# Patient Record
Sex: Female | Born: 1935 | Race: White | Hispanic: No | Smoking: Never smoker
Health system: Southern US, Community
[De-identification: ages and names within clinical notes are randomized; demographics above are authoritative.]

## PROBLEM LIST (undated history)

## (undated) DIAGNOSIS — I4891 Unspecified atrial fibrillation: Secondary | ICD-10-CM

## (undated) DIAGNOSIS — I350 Nonrheumatic aortic (valve) stenosis: Secondary | ICD-10-CM

## (undated) DIAGNOSIS — I10 Essential (primary) hypertension: Secondary | ICD-10-CM

## (undated) DIAGNOSIS — E876 Hypokalemia: Secondary | ICD-10-CM

## (undated) DIAGNOSIS — I739 Peripheral vascular disease, unspecified: Secondary | ICD-10-CM

## (undated) DIAGNOSIS — N189 Chronic kidney disease, unspecified: Secondary | ICD-10-CM

## (undated) DIAGNOSIS — E119 Type 2 diabetes mellitus without complications: Secondary | ICD-10-CM

## (undated) DIAGNOSIS — G4730 Sleep apnea, unspecified: Secondary | ICD-10-CM

## (undated) DIAGNOSIS — N760 Acute vaginitis: Secondary | ICD-10-CM

## (undated) DIAGNOSIS — R41841 Cognitive communication deficit: Secondary | ICD-10-CM

## (undated) DIAGNOSIS — Z950 Presence of cardiac pacemaker: Secondary | ICD-10-CM

## (undated) DIAGNOSIS — R569 Unspecified convulsions: Secondary | ICD-10-CM

## (undated) DIAGNOSIS — F0390 Unspecified dementia without behavioral disturbance: Secondary | ICD-10-CM

## (undated) DIAGNOSIS — L97509 Non-pressure chronic ulcer of other part of unspecified foot with unspecified severity: Secondary | ICD-10-CM

## (undated) DIAGNOSIS — C801 Malignant (primary) neoplasm, unspecified: Secondary | ICD-10-CM

## (undated) DIAGNOSIS — N390 Urinary tract infection, site not specified: Secondary | ICD-10-CM

## (undated) DIAGNOSIS — M4800 Spinal stenosis, site unspecified: Secondary | ICD-10-CM

---

## 2015-07-23 ENCOUNTER — Ambulatory Visit: Payer: Medicare Other

## 2015-07-31 ENCOUNTER — Ambulatory Visit: Payer: Medicare Other | Admitting: Podiatry

## 2015-12-29 ENCOUNTER — Emergency Department: Payer: Medicare Other

## 2015-12-29 ENCOUNTER — Inpatient Hospital Stay
Admission: EM | Admit: 2015-12-29 | Discharge: 2016-01-08 | DRG: 291 | Disposition: A | Discharge status: Skilled Nursing Facility | Payer: Medicare Other | Attending: Internal Medicine | Admitting: Internal Medicine

## 2015-12-29 ENCOUNTER — Encounter: Payer: Self-pay | Admitting: Emergency Medicine

## 2015-12-29 DIAGNOSIS — E1122 Type 2 diabetes mellitus with diabetic chronic kidney disease: Secondary | ICD-10-CM | POA: Diagnosis present

## 2015-12-29 DIAGNOSIS — J9621 Acute and chronic respiratory failure with hypoxia: Secondary | ICD-10-CM | POA: Diagnosis present

## 2015-12-29 DIAGNOSIS — L97519 Non-pressure chronic ulcer of other part of right foot with unspecified severity: Secondary | ICD-10-CM | POA: Diagnosis present

## 2015-12-29 DIAGNOSIS — L97529 Non-pressure chronic ulcer of other part of left foot with unspecified severity: Secondary | ICD-10-CM | POA: Diagnosis present

## 2015-12-29 DIAGNOSIS — Y95 Nosocomial condition: Secondary | ICD-10-CM | POA: Diagnosis present

## 2015-12-29 DIAGNOSIS — Y92230 Patient room in hospital as the place of occurrence of the external cause: Secondary | ICD-10-CM | POA: Diagnosis present

## 2015-12-29 DIAGNOSIS — R34 Anuria and oliguria: Secondary | ICD-10-CM | POA: Diagnosis not present

## 2015-12-29 DIAGNOSIS — G309 Alzheimer's disease, unspecified: Secondary | ICD-10-CM | POA: Diagnosis present

## 2015-12-29 DIAGNOSIS — J9610 Chronic respiratory failure, unspecified whether with hypoxia or hypercapnia: Secondary | ICD-10-CM | POA: Diagnosis not present

## 2015-12-29 DIAGNOSIS — N183 Chronic kidney disease, stage 3 (moderate): Secondary | ICD-10-CM | POA: Diagnosis present

## 2015-12-29 DIAGNOSIS — Z825 Family history of asthma and other chronic lower respiratory diseases: Secondary | ICD-10-CM | POA: Diagnosis not present

## 2015-12-29 DIAGNOSIS — E871 Hypo-osmolality and hyponatremia: Secondary | ICD-10-CM | POA: Diagnosis present

## 2015-12-29 DIAGNOSIS — I2510 Atherosclerotic heart disease of native coronary artery without angina pectoris: Secondary | ICD-10-CM | POA: Diagnosis present

## 2015-12-29 DIAGNOSIS — E039 Hypothyroidism, unspecified: Secondary | ICD-10-CM | POA: Diagnosis present

## 2015-12-29 DIAGNOSIS — I509 Heart failure, unspecified: Secondary | ICD-10-CM | POA: Diagnosis not present

## 2015-12-29 DIAGNOSIS — Z8249 Family history of ischemic heart disease and other diseases of the circulatory system: Secondary | ICD-10-CM

## 2015-12-29 DIAGNOSIS — Z950 Presence of cardiac pacemaker: Secondary | ICD-10-CM

## 2015-12-29 DIAGNOSIS — Z794 Long term (current) use of insulin: Secondary | ICD-10-CM | POA: Diagnosis not present

## 2015-12-29 DIAGNOSIS — Z91040 Latex allergy status: Secondary | ICD-10-CM | POA: Diagnosis not present

## 2015-12-29 DIAGNOSIS — Z888 Allergy status to other drugs, medicaments and biological substances status: Secondary | ICD-10-CM

## 2015-12-29 DIAGNOSIS — R739 Hyperglycemia, unspecified: Secondary | ICD-10-CM | POA: Diagnosis not present

## 2015-12-29 DIAGNOSIS — Z6831 Body mass index (BMI) 31.0-31.9, adult: Secondary | ICD-10-CM

## 2015-12-29 DIAGNOSIS — E1151 Type 2 diabetes mellitus with diabetic peripheral angiopathy without gangrene: Secondary | ICD-10-CM | POA: Diagnosis present

## 2015-12-29 DIAGNOSIS — J9601 Acute respiratory failure with hypoxia: Secondary | ICD-10-CM

## 2015-12-29 DIAGNOSIS — J440 Chronic obstructive pulmonary disease with acute lower respiratory infection: Secondary | ICD-10-CM | POA: Diagnosis present

## 2015-12-29 DIAGNOSIS — N179 Acute kidney failure, unspecified: Secondary | ICD-10-CM

## 2015-12-29 DIAGNOSIS — G4733 Obstructive sleep apnea (adult) (pediatric): Secondary | ICD-10-CM | POA: Diagnosis present

## 2015-12-29 DIAGNOSIS — I959 Hypotension, unspecified: Secondary | ICD-10-CM | POA: Diagnosis present

## 2015-12-29 DIAGNOSIS — E669 Obesity, unspecified: Secondary | ICD-10-CM | POA: Diagnosis present

## 2015-12-29 DIAGNOSIS — I739 Peripheral vascular disease, unspecified: Secondary | ICD-10-CM | POA: Diagnosis present

## 2015-12-29 DIAGNOSIS — J189 Pneumonia, unspecified organism: Secondary | ICD-10-CM | POA: Diagnosis not present

## 2015-12-29 DIAGNOSIS — I252 Old myocardial infarction: Secondary | ICD-10-CM | POA: Diagnosis not present

## 2015-12-29 DIAGNOSIS — R339 Retention of urine, unspecified: Secondary | ICD-10-CM | POA: Diagnosis not present

## 2015-12-29 DIAGNOSIS — Z833 Family history of diabetes mellitus: Secondary | ICD-10-CM

## 2015-12-29 DIAGNOSIS — T380X5A Adverse effect of glucocorticoids and synthetic analogues, initial encounter: Secondary | ICD-10-CM | POA: Diagnosis not present

## 2015-12-29 DIAGNOSIS — T501X5A Adverse effect of loop [high-ceiling] diuretics, initial encounter: Secondary | ICD-10-CM | POA: Diagnosis present

## 2015-12-29 DIAGNOSIS — J159 Unspecified bacterial pneumonia: Secondary | ICD-10-CM | POA: Diagnosis present

## 2015-12-29 DIAGNOSIS — I130 Hypertensive heart and chronic kidney disease with heart failure and stage 1 through stage 4 chronic kidney disease, or unspecified chronic kidney disease: Principal | ICD-10-CM | POA: Diagnosis present

## 2015-12-29 DIAGNOSIS — Z89422 Acquired absence of other left toe(s): Secondary | ICD-10-CM

## 2015-12-29 DIAGNOSIS — Z79899 Other long term (current) drug therapy: Secondary | ICD-10-CM

## 2015-12-29 DIAGNOSIS — Z8673 Personal history of transient ischemic attack (TIA), and cerebral infarction without residual deficits: Secondary | ICD-10-CM

## 2015-12-29 DIAGNOSIS — I5023 Acute on chronic systolic (congestive) heart failure: Secondary | ICD-10-CM | POA: Diagnosis present

## 2015-12-29 DIAGNOSIS — J9600 Acute respiratory failure, unspecified whether with hypoxia or hypercapnia: Secondary | ICD-10-CM | POA: Diagnosis not present

## 2015-12-29 DIAGNOSIS — E1165 Type 2 diabetes mellitus with hyperglycemia: Secondary | ICD-10-CM | POA: Diagnosis present

## 2015-12-29 DIAGNOSIS — I248 Other forms of acute ischemic heart disease: Secondary | ICD-10-CM | POA: Diagnosis present

## 2015-12-29 DIAGNOSIS — F0280 Dementia in other diseases classified elsewhere without behavioral disturbance: Secondary | ICD-10-CM | POA: Diagnosis present

## 2015-12-29 DIAGNOSIS — J810 Acute pulmonary edema: Secondary | ICD-10-CM | POA: Diagnosis not present

## 2015-12-29 DIAGNOSIS — E11621 Type 2 diabetes mellitus with foot ulcer: Secondary | ICD-10-CM | POA: Diagnosis present

## 2015-12-29 DIAGNOSIS — E875 Hyperkalemia: Secondary | ICD-10-CM | POA: Diagnosis present

## 2015-12-29 DIAGNOSIS — I5020 Unspecified systolic (congestive) heart failure: Secondary | ICD-10-CM | POA: Diagnosis not present

## 2015-12-29 HISTORY — DX: Hypokalemia: E87.6

## 2015-12-29 HISTORY — DX: Essential (primary) hypertension: I10

## 2015-12-29 HISTORY — DX: Non-pressure chronic ulcer of other part of unspecified foot with unspecified severity: L97.509

## 2015-12-29 HISTORY — DX: Unspecified dementia, unspecified severity, without behavioral disturbance, psychotic disturbance, mood disturbance, and anxiety: F03.90

## 2015-12-29 HISTORY — DX: Spinal stenosis, site unspecified: M48.00

## 2015-12-29 HISTORY — DX: Presence of cardiac pacemaker: Z95.0

## 2015-12-29 HISTORY — DX: Type 2 diabetes mellitus without complications: E11.9

## 2015-12-29 HISTORY — DX: Acute vaginitis: N76.0

## 2015-12-29 HISTORY — DX: Unspecified convulsions: R56.9

## 2015-12-29 HISTORY — DX: Peripheral vascular disease, unspecified: I73.9

## 2015-12-29 HISTORY — DX: Chronic kidney disease, unspecified: N18.9

## 2015-12-29 HISTORY — DX: Malignant (primary) neoplasm, unspecified: C80.1

## 2015-12-29 HISTORY — DX: Cognitive communication deficit: R41.841

## 2015-12-29 HISTORY — DX: Urinary tract infection, site not specified: N39.0

## 2015-12-29 HISTORY — DX: Nonrheumatic aortic (valve) stenosis: I35.0

## 2015-12-29 HISTORY — DX: Unspecified atrial fibrillation: I48.91

## 2015-12-29 HISTORY — DX: Sleep apnea, unspecified: G47.30

## 2015-12-29 LAB — BLOOD GAS, ARTERIAL
ACID-BASE EXCESS: 8.8 mmol/L — AB (ref 0.0–3.0)
BICARBONATE: 36.7 meq/L — AB (ref 21.0–28.0)
Delivery systems: POSITIVE
EXPIRATORY PAP: 8
FIO2: 0.4
Inspiratory PAP: 12
MECHANICAL RATE: 10
O2 SAT: 96.4 %
PATIENT TEMPERATURE: 37
PH ART: 7.36 (ref 7.350–7.450)
PO2 ART: 88 mmHg (ref 83.0–108.0)
pCO2 arterial: 65 mmHg — ABNORMAL HIGH (ref 32.0–48.0)

## 2015-12-29 LAB — BASIC METABOLIC PANEL
ANION GAP: 9 (ref 5–15)
BUN: 64 mg/dL — ABNORMAL HIGH (ref 6–20)
CHLORIDE: 99 mmol/L — AB (ref 101–111)
CO2: 32 mmol/L (ref 22–32)
Calcium: 8.9 mg/dL (ref 8.9–10.3)
Creatinine, Ser: 1.75 mg/dL — ABNORMAL HIGH (ref 0.44–1.00)
GFR, EST AFRICAN AMERICAN: 31 mL/min — AB (ref >60)
GFR, EST NON AFRICAN AMERICAN: 27 mL/min — AB (ref >60)
Glucose, Bld: 392 mg/dL — ABNORMAL HIGH (ref 65–99)
POTASSIUM: 4.4 mmol/L (ref 3.5–5.1)
SODIUM: 140 mmol/L (ref 135–145)

## 2015-12-29 LAB — CBC WITH DIFFERENTIAL/PLATELET
BASOS ABS: 0 10*3/uL (ref 0–0.1)
BASOS PCT: 0 %
Eosinophils Absolute: 0.1 10*3/uL (ref 0–0.7)
Eosinophils Relative: 0 %
HEMATOCRIT: 33.4 % — AB (ref 35.0–47.0)
Hemoglobin: 10.8 g/dL — ABNORMAL LOW (ref 12.0–16.0)
LYMPHS PCT: 4 %
Lymphs Abs: 0.8 10*3/uL — ABNORMAL LOW (ref 1.0–3.6)
MCH: 29.2 pg (ref 26.0–34.0)
MCHC: 32.3 g/dL (ref 32.0–36.0)
MCV: 90.5 fL (ref 80.0–100.0)
MONO ABS: 0.9 10*3/uL (ref 0.2–0.9)
Monocytes Relative: 5 %
NEUTROS ABS: 18 10*3/uL — AB (ref 1.4–6.5)
Neutrophils Relative %: 91 %
PLATELETS: 232 10*3/uL (ref 150–440)
RBC: 3.69 MIL/uL — AB (ref 3.80–5.20)
RDW: 18.7 % — AB (ref 11.5–14.5)
WBC: 19.8 10*3/uL — AB (ref 3.6–11.0)

## 2015-12-29 LAB — TROPONIN I: TROPONIN I: 0.06 ng/mL — AB (ref <0.031)

## 2015-12-29 LAB — LACTIC ACID, PLASMA: Lactic Acid, Venous: 1.5 mmol/L (ref 0.5–2.0)

## 2015-12-29 MED ORDER — SODIUM CHLORIDE 0.9 % IJ SOLN
3.0000 mL | Freq: Two times a day (BID) | INTRAMUSCULAR | Status: DC
  Administered 2015-12-30 – 2016-01-08 (×19): 3 mL via INTRAVENOUS

## 2015-12-29 MED ORDER — VANCOMYCIN HCL IN DEXTROSE 1-5 GM/200ML-% IV SOLN
1000.0000 mg | Freq: Once | INTRAVENOUS | Status: AC
  Administered 2015-12-30: 1000 mg via INTRAVENOUS
  Filled 2015-12-29 (×3): qty 200

## 2015-12-29 MED ORDER — SODIUM CHLORIDE 0.9 % IJ SOLN
3.0000 mL | Freq: Two times a day (BID) | INTRAMUSCULAR | Status: DC
  Administered 2015-12-30 – 2016-01-05 (×13): 3 mL via INTRAVENOUS

## 2015-12-29 MED ORDER — ACETAMINOPHEN 325 MG PO TABS
650.0000 mg | ORAL_TABLET | Freq: Four times a day (QID) | ORAL | Status: DC | PRN
Start: 2015-12-29 — End: 2016-01-08

## 2015-12-29 MED ORDER — FUROSEMIDE 10 MG/ML IJ SOLN
80.0000 mg | Freq: Once | INTRAMUSCULAR | Status: AC
  Administered 2015-12-30: 80 mg via INTRAVENOUS
  Filled 2015-12-29: qty 8

## 2015-12-29 MED ORDER — PIPERACILLIN-TAZOBACTAM 3.375 G IVPB
3.3750 g | Freq: Once | INTRAVENOUS | Status: AC
  Administered 2015-12-30: 3.375 g via INTRAVENOUS
  Filled 2015-12-29: qty 50

## 2015-12-29 MED ORDER — ASPIRIN EC 81 MG PO TBEC
81.0000 mg | DELAYED_RELEASE_TABLET | Freq: Every day | ORAL | Status: DC
  Administered 2015-12-30 – 2016-01-08 (×10): 81 mg via ORAL
  Filled 2015-12-29 (×10): qty 1

## 2015-12-29 MED ORDER — LEVOFLOXACIN IN D5W 750 MG/150ML IV SOLN
750.0000 mg | Freq: Once | INTRAVENOUS | Status: AC
  Administered 2015-12-30: 750 mg via INTRAVENOUS
  Filled 2015-12-29: qty 150

## 2015-12-29 MED ORDER — NITROGLYCERIN 2 % TD OINT
TOPICAL_OINTMENT | TRANSDERMAL | Status: AC
  Administered 2015-12-29: 1 [in_us] via TOPICAL
  Filled 2015-12-29: qty 1

## 2015-12-29 MED ORDER — ONDANSETRON HCL 4 MG/2ML IJ SOLN
4.0000 mg | Freq: Four times a day (QID) | INTRAMUSCULAR | Status: DC | PRN
Start: 2015-12-29 — End: 2016-01-08
  Administered 2015-12-30: 4 mg via INTRAVENOUS
  Filled 2015-12-29: qty 2

## 2015-12-29 MED ORDER — SODIUM CHLORIDE 0.9 % IV SOLN: 250.0000 mL | INTRAVENOUS | Status: DC | PRN

## 2015-12-29 MED ORDER — ACETAMINOPHEN 650 MG RE SUPP: 650.0000 mg | Freq: Four times a day (QID) | RECTAL | Status: DC | PRN

## 2015-12-29 MED ORDER — ALBUTEROL SULFATE (2.5 MG/3ML) 0.083% IN NEBU
5.0000 mg | INHALATION_SOLUTION | Freq: Once | RESPIRATORY_TRACT | Status: AC
  Administered 2015-12-29: 5 mg via RESPIRATORY_TRACT
  Filled 2015-12-29: qty 6

## 2015-12-29 MED ORDER — AZITHROMYCIN 250 MG PO TABS: 500.0000 mg | ORAL_TABLET | Freq: Every day | ORAL | Status: DC

## 2015-12-29 MED ORDER — METHYLPREDNISOLONE SODIUM SUCC 125 MG IJ SOLR
60.0000 mg | Freq: Two times a day (BID) | INTRAMUSCULAR | Status: DC
  Administered 2015-12-30 – 2016-01-01 (×5): 60 mg via INTRAVENOUS
  Filled 2015-12-29 (×5): qty 2

## 2015-12-29 MED ORDER — SODIUM CHLORIDE 0.9 % IJ SOLN
3.0000 mL | INTRAMUSCULAR | Status: DC | PRN
  Administered 2015-12-30: 3 mL via INTRAVENOUS
  Filled 2015-12-29: qty 10

## 2015-12-29 MED ORDER — INSULIN ASPART 100 UNIT/ML ~~LOC~~ SOLN
0.0000 [IU] | Freq: Every day | SUBCUTANEOUS | Status: DC
  Administered 2015-12-30: 4 [IU] via SUBCUTANEOUS
  Administered 2015-12-31: 5 [IU] via SUBCUTANEOUS
  Administered 2016-01-04 – 2016-01-07 (×2): 2 [IU] via SUBCUTANEOUS
  Filled 2015-12-29: qty 4
  Filled 2015-12-29: qty 11
  Filled 2015-12-29: qty 5
  Filled 2015-12-29 (×2): qty 2

## 2015-12-29 MED ORDER — ENOXAPARIN SODIUM 30 MG/0.3ML ~~LOC~~ SOLN
30.0000 mg | SUBCUTANEOUS | Status: DC
  Administered 2015-12-30 – 2015-12-31 (×2): 30 mg via SUBCUTANEOUS
  Filled 2015-12-29 (×2): qty 0.3

## 2015-12-29 MED ORDER — METHYLPREDNISOLONE SODIUM SUCC 125 MG IJ SOLR
125.0000 mg | Freq: Once | INTRAMUSCULAR | Status: AC
  Administered 2015-12-29: 125 mg via INTRAVENOUS
  Filled 2015-12-29: qty 2

## 2015-12-29 MED ORDER — NITROGLYCERIN 2 % TD OINT
1.0000 [in_us] | TOPICAL_OINTMENT | TRANSDERMAL | Status: AC
  Administered 2015-12-29: 1 [in_us] via TOPICAL
  Filled 2015-12-29: qty 1

## 2015-12-29 MED ORDER — BISACODYL 10 MG RE SUPP: 10.0000 mg | Freq: Every day | RECTAL | Status: DC | PRN

## 2015-12-29 MED ORDER — POLYETHYLENE GLYCOL 3350 17 G PO PACK
17.0000 g | PACK | Freq: Every day | ORAL | Status: DC | PRN
  Administered 2016-01-06: 17 g via ORAL
  Filled 2015-12-29: qty 1

## 2015-12-29 MED ORDER — IPRATROPIUM-ALBUTEROL 0.5-2.5 (3) MG/3ML IN SOLN
3.0000 mL | RESPIRATORY_TRACT | Status: DC
  Administered 2015-12-30 – 2016-01-01 (×14): 3 mL via RESPIRATORY_TRACT
  Filled 2015-12-29 (×14): qty 3

## 2015-12-29 MED ORDER — INSULIN ASPART 100 UNIT/ML ~~LOC~~ SOLN
0.0000 [IU] | Freq: Three times a day (TID) | SUBCUTANEOUS | Status: DC
  Administered 2015-12-30: 15 [IU] via SUBCUTANEOUS
  Administered 2015-12-30: 11 [IU] via SUBCUTANEOUS
  Administered 2015-12-31: 15 [IU] via SUBCUTANEOUS
  Administered 2015-12-31: 3 [IU] via SUBCUTANEOUS
  Administered 2015-12-31 – 2016-01-01 (×2): 11 [IU] via SUBCUTANEOUS
  Administered 2016-01-01: 15 [IU] via SUBCUTANEOUS
  Administered 2016-01-01: 11 [IU] via SUBCUTANEOUS
  Administered 2016-01-02: 4 [IU] via SUBCUTANEOUS
  Administered 2016-01-03: 11 [IU] via SUBCUTANEOUS
  Administered 2016-01-04 (×2): 4 [IU] via SUBCUTANEOUS
  Administered 2016-01-04: 3 [IU] via SUBCUTANEOUS
  Administered 2016-01-05 (×3): 4 [IU] via SUBCUTANEOUS
  Administered 2016-01-06: 7 [IU] via SUBCUTANEOUS
  Administered 2016-01-06: 4 [IU] via SUBCUTANEOUS
  Administered 2016-01-07: 11 [IU] via SUBCUTANEOUS
  Administered 2016-01-07: 4 [IU] via SUBCUTANEOUS
  Administered 2016-01-07: 7 [IU] via SUBCUTANEOUS
  Administered 2016-01-08 (×2): 3 [IU] via SUBCUTANEOUS
  Filled 2015-12-29 (×2): qty 4
  Filled 2015-12-29: qty 11
  Filled 2015-12-29: qty 4
  Filled 2015-12-29: qty 11
  Filled 2015-12-29 (×2): qty 4
  Filled 2015-12-29: qty 7
  Filled 2015-12-29: qty 15
  Filled 2015-12-29: qty 11
  Filled 2015-12-29: qty 4
  Filled 2015-12-29: qty 3
  Filled 2015-12-29: qty 4
  Filled 2015-12-29: qty 15
  Filled 2015-12-29: qty 7
  Filled 2015-12-29: qty 11
  Filled 2015-12-29: qty 3
  Filled 2015-12-29: qty 4
  Filled 2015-12-29: qty 3
  Filled 2015-12-29: qty 11
  Filled 2015-12-29: qty 3
  Filled 2015-12-29: qty 15

## 2015-12-29 MED ORDER — ONDANSETRON HCL 4 MG PO TABS: 4.0000 mg | ORAL_TABLET | Freq: Four times a day (QID) | ORAL | Status: DC | PRN

## 2015-12-29 MED ORDER — FUROSEMIDE 10 MG/ML IJ SOLN
40.0000 mg | Freq: Two times a day (BID) | INTRAMUSCULAR | Status: DC
  Administered 2015-12-30 – 2015-12-31 (×3): 40 mg via INTRAVENOUS
  Filled 2015-12-29 (×3): qty 4

## 2015-12-29 MED ORDER — DOCUSATE SODIUM 100 MG PO CAPS
100.0000 mg | ORAL_CAPSULE | Freq: Two times a day (BID) | ORAL | Status: DC
  Administered 2015-12-30 – 2016-01-08 (×19): 100 mg via ORAL
  Filled 2015-12-29 (×20): qty 1

## 2015-12-29 NOTE — ED Notes (Signed)
Patient brought in by ems from [facility]. Patient with respiratory distress. Patient oxygen saturations were in the 70's by ems. Patient was given 2 duonebs by ems.

## 2015-12-29 NOTE — ED Provider Notes (Signed)
Patient resting comfortably. Alerts well, amicable and in no distress on BiPAP. No longer hypoxic, in no distress. Updated family and discussed case and care. After discussion with the family, she does follow [facility] however she is doing well here and they request admission to our hospital after further discussion. I originally wanted to [facility], but given that she is on BiPAP she require critical care transport, and family does not wish to wait for a long transfer process. Admitted.   Delman Kitten, MD 12/29/15 [phone]

## 2015-12-29 NOTE — ED Provider Notes (Signed)
[facility] Emergency Department Provider Note  ____________________________________________  Time seen: 10:00 PM on arrival by EMS  I have reviewed the triage vital signs and the nursing notes.   HISTORY  Chief Complaint Respiratory Distress  History Limited by respirator distress.  HPI Kylie Martinez is a 79 y.o. female sent to the ED [facility] for respiratory distress.This happened suddenly approximately 30 or 45 minutes ago. Staff at this facility report that the patient has a history of COPD and hypercapnia. Patient is unable to provide history at this time due to her respiratory distress.  [employer] medical record reveals history of hypertension diabetes CAD intracranial hemorrhage 2 years ago CK D pacemaker and CHF. No mention of COPD on her problem list from the facility, there are no listing for any bronchodilators. She is on BiPAP at all times per her medication reconciliation.  Room air saturation at the facility was about 75%. On 6 L nasal cannula she is about 85%. On nonrebreather she is 88% On arrival.    Active Allergies Allergen Noted Date Severity Reactions Comments  Ace Inhibitors 03/22/2014  Other (See Comments) Hyperkalemia   Arb-Angiotensin Receptor Antagonist 03/22/2014  Other (See Comments) Hyperkalemia   Dristan Allergy 10/03/2013 Medium Itching   Latex 09/12/2013 Low Rash Other reaction(s): Rash   Levetiracetam 05/11/2014 Low Itching   Other 09/12/2013   Other reaction(s): Nausea and Vomiting  Drestane   Silvadene 07/19/2015  Other (See Comments) Silver alganate tape and cream   Silver 08/01/2015 Medium Rash Patient's daughter stated her foot blew up in a red rash   Current Medications Prescription Sig. Disp. Refills Start Date End Date Status  aspirin 81 MG chewable tablet Chew 1 tablet (81 mg total) daily. 30 tablet  11 09/10/2014 09/10/2016 Active  polyethylene glycol (MIRALAX) 17 gram packet Take  17 g by mouth two (2) times a day as needed.      Active  docusate sodium (COLACE) 100 MG capsule Take 1 capsule (100 mg total) by mouth Two (2) times a day. 60 capsule  1 01/02/2015 01/02/2016 Active  ascorbic acid (VITAMIN C) 500 MG tablet Take 500 mg by mouth daily.      Active  Lactobacillus acidoph-L.bulgar (LACTINEX) 100 million cell tablet Take 1 packet by mouth Two (2) times a day.     Active  multivitamin (THERAGRAN) per tablet Take 1 tablet by mouth daily. 30 tablet  11 06/08/2015 06/07/2016 Active  acetaminophen (TYLENOL) 325 MG tablet Take 2 tablets (650 mg total) by mouth every six (6) hours as needed. 30 tablet  0 08/03/2015  Active  amLODIPine (NORVASC) 10 MG tablet Take 1 tablet (10 mg total) by mouth daily. 30 tablet  11 08/03/2015 08/02/2016 Active  gabapentin (NEURONTIN) 100 MG capsule Take 1 capsule (100 mg total) by mouth Two (2) times a day. 270 capsule  3 08/03/2015 08/02/2016 Active  potassium chloride SA (K-DUR,KLOR-CON) 20 MEQ tablet Take 1 tablet (20 mEq total) by mouth daily. 30 tablet  11 08/03/2015 08/02/2016 Active  omega-3 fatty acids-vitamin E (FISH OIL) 1,000 mg cap Take 1,000 mg by mouth daily at 0600. 90 each  3 08/08/2015  Active  nitroglycerin (NITROSTAT) 0.4 MG SL tablet Place 0.4 mg under the tongue every five (5) minutes as needed for chest pain.     Active  insulin NPH (HUMULIN,NOVOLIN) 100 unit/mL injection Inject 20 Units under the skin daily. 26units qAM 21units qPM 3 mL  11 10/10/2015 10/09/2016 Active  furosemide (LASIX) 40  MG tablet Take 3 tablets (120 mg total) by mouth daily.  0 10/10/2015  Active  ferrous sulfate 325 (65 FE) MG tablet Take 1 tablet (325 mg total) by mouth daily. 30 tablet  3 10/10/2015 10/09/2016 Active  clindamycin (CLEOCIN) 300 MG capsule Take 300 mg by mouth three (3) times a day (at 6am, noon and 6pm). Take for 7 days   11/02/2015  Active  carvedilol (COREG) 3.125 MG tablet Take 1 tablet (3.125 mg  total) by mouth Two (2) times a day. 60 tablet  11 11/09/2015 11/08/2016 Active  Active Problems Problem Noted Date  Non-pressure chronic ulcer of other part of left foot with necrosis of bone (RAF-HCC) 10/01/2015  Presence of cardiac pacemaker 05/19/2015  Critical lower limb ischemia 05/09/2015  Diabetic ulcer of left foot associated with type 2 diabetes mellitus (RAF-HCC) 05/01/2015  Ischemic ulcer of toe (RAF-HCC) 03/19/2015  PAD (peripheral artery disease) (RAF-HCC) 03/19/2015  Dry gangrene (RAF-HCC) 03/19/2015  Foot ulcer (RAF-HCC) 03/16/2015  Itching 03/08/2015  Periodic limb movement 03/08/2015  Aortic stenosis 03/08/2015  Overview:   Moderate, echo 5/16. Pt and family not interested in surgery given risks   Hypothyroidism 03/08/2015  Sleep apnea 08/01/2014  Overview:   Home NIPPV settings 20/17 Baseline pCO2 50s   Vascular dementia 05/15/2014  Hypercarbia 05/15/2014  Stroke (RAF-HCC) [id number]  Combined systolic and diastolic heart failure (RAF-HCC) 04/20/2014  Overview:   05/01/15 Echo: LVEF 45-50%    Obesity (BMI 30-39.9) 01/13/2014  Intracranial hemorrhage (RAF-HCC) 07/14/2013  Overview:   Last Assessment & Plan:  Off Xarelto and Aspirin. Continue to monitor.   Chronic kidney disease 07/13/2013  Atrial fibrillation (RAF-HCC) 07/13/2013  Overview:   On asa    Essential hypertension 07/13/2013  Convulsions (RAF-HCC) 07/13/2013  Seizure (RAF-HCC) 07/13/2013  Overview:   Last Assessment & Plan:  No seizure activity on EEG. Keppra IV given inconsistence PO intake.   Acute non-ST-elevation myocardial infarction (RAF-HCC) 07/13/2013  Overview:   Last Assessment & Plan:  Hold aspirin and xarelto for now. Continue statin Patient's family worried about wide complexes on telemetry Patient in Afib with paced rhythm with no changes compared to The previous EKGs   Type II diabetes mellitus, uncontrolled (RAF-HCC) 07/09/2013  Overview:   06/2015 a1c 7.2    Spinal stenosis of lumbar region with neurogenic claudication 05/30/2011  Resolved Problems Problem Noted Date Resolved Date  Acute renal failure (ARF) (RAF-HCC) 07/29/2015 09/06/2015  Hyponatremia 07/29/2015 10/09/2015  Hypokalemia 07/29/2015 10/09/2015  Cellulitis of foot, left 05/19/2015 09/06/2015  Cellulitis 03/16/2015 09/06/2015  Memory change 03/08/2015 10/09/2015  Moderate obstructive sleep apnea 03/08/2015 10/09/2015  Chest pain 09/10/2014 09/10/2014  Memory deficit 10/19/2013 05/15/2014  Intracranial hemorrhage (RAF-HCC) 07/14/2013 05/15/2014  Acute renal failure (RAF-HCC) 07/13/2013 01/02/2015  Bacteremia 07/13/2013 01/02/2015  Acute myocardial infarction, subendocardial infarction (RAF-HCC) 07/13/2013 05/02/2015  Nonspecific elevation of level of transaminase or lactic acid dehydrogenase (LDH) 07/13/2013 10/09/2015  Gram-positive bacteremia 07/13/2013 09/06/2015  Overview:   Last Assessment & Plan:  Off antibiotics per ID. Will repeat blood cultures in 48 hours.   Elevation of level of transaminase or lactic acid dehydrogenase (LDH) 07/13/2013 10/09/2015  Overview:   Last Assessment & Plan:  improving   Hypertension 07/13/2013 10/09/2015  Overview:   Last Assessment & Plan:  Hydralazine prn metoprolol   Encephalopathy 07/09/2013 09/06/2015  Delirium 07/09/2013 09/06/2015  Overview:   Last Assessment & Plan:  EEG shows diffuse slowing the brain activity consistent with encephalopathy  No seizures noted. ?due to intracranial hemorrhage  Type 2 diabetes mellitus with hyperglycemia (RAF-HCC) 07/09/2013 10/09/2015  Overview:   Last Assessment & Plan:   No further hypoglycemic eppisodes Continue lispro correctional scale   Spinal stenosis of lumbar region 05/30/2011 10/09/2015  Most Recent Encounters Date Type Specialty Providers Description  11/09/2015 Orders Only Geriatric Medicine Maryan Rued, MD  Chronic combined systolic and diastolic  congestive heart failure (RAF-HCC) (Primary Dx)  11/08/2015 Documentation Geriatric Medicine Maryan Rued, MD    11/08/2015 MyChart Message Encounter Geriatric Medicine Maryan Rued, MD  [id number] Question  Immunizations Name Dates Previously Given Next Due  Influenza Virus Vaccine, unspecified formulation 09/14/2012, 12/09/2010, 10/22/2007   Influenza(Tri IIV3)6-35 MO PF(IM)Historical 10/10/2015   Pneumococcal Conjugate 13-Valent 08/08/2014   Pneumococcal Polysaccharide 23 09/26/2013   Td (adult) unspecified formulation 07/15/2006   Surgical History Surgery Date Comments  Mastectomy    Carpal Tunnel Release    Cholecystectomy    Thyroidectomy, Partial 12/29/1974   Cataract Extraction    Minimally Invasive Microdiscectomy Lumbar Spine    Pr Debridement, Skin, Sub-Q Tissue,Muscle,Bone,=<20 Sq Cm 06/05/2015 Procedure: DEBRIDEMENT; SKIN, SUBCUTANEOUS TISSUE, MUSCLE, & BONE FOOT; Surgeon: Christean Grief, MD; Location: MAIN [facility]; Service: Vascular  Medical History Medical History Date Comments  Memory deficit 10/19/2013   Type II or unspecified type diabetes mellitus without mention of complication, not stated as uncontrolled    Essential hypertension    Atrial fibrillation (RAF-HCC)    CKD (chronic kidney disease), stage IV (RAF-HCC)    Hemorrhage, intracranial (RAF-HCC) 06/2013 Probable hemorrhagic conversion of ischemic CVA while on Xarelto  Stroke (RAF-HCC) 06/2013   Sleep apnea    Cancer (RAF-HCC) 12/29/2005 R breast. No chemo or XRT needed.  Combined systolic and diastolic heart failure (RAF-HCC) 04/20/2014   Pacemaker 2001 For complete heart block. [employer]  Dry gangrene (RAF-HCC) 03/19/2015   Family History Medical History Relation Name Comments  Cancer Mother  lung  Diabetes Mother    Neuropathy Mother    Emphysema Mother    Cancer Sister ##Sister1 lung  Diabetes Sister ##Sister1   Heart disease  Sister ##Sister1    Relation Name Status Comments  Mother  Deceased Age:23's   Father  Deceased Age:45's   Social History Tobacco Use Types Packs/Day Years Used Date  Never Smoker       Smokeless Tobacco: Never Used          No past medical history on file. As above  There are no active problems to display for this patient.    No past surgical history on file.   No current outpatient prescriptions on file.   Allergies Review of patient's allergies indicates not on file.   No family history on file.  Social History Social History  Substance Use Topics  . Smoking status: Not on file  . Smokeless tobacco: Not on file  . Alcohol Use: Not on file    Review of Systems  Unable to obtain due to severe respiratory distress. Unable to speak.  ____________________________________________   PHYSICAL EXAM:  VITAL SIGNS: ED Triage Vitals  Enc Vitals Group     BP --      Pulse --      Resp --      Temp --      Temp src --      SpO2 --      Weight --      Height --      Head Cir --      Peak Flow --  Pain Score --      Pain Loc --      Pain Edu? --      Excl. in [place]? --     Vital signs reviewed, nursing assessments reviewed.   Constitutional:   Alert and oriented. Severe respiratory distress Eyes:   No scleral icterus. No conjunctival pallor. PERRL. EOMI ENT   Head:   Normocephalic and atraumatic.   Nose:   No congestion/rhinnorhea. No septal hematoma   Mouth/Throat:   MMM, no pharyngeal erythema. No peritonsillar mass. No uvula shift.   Neck:   No stridor. No SubQ emphysema. No meningismus. Hematological/Lymphatic/Immunilogical:   No cervical lymphadenopathy. Cardiovascular:   RRR. Normal and symmetric distal pulses are present in all extremities. No murmurs, rubs, or gallops. Respiratory:   Severely diminished breath sounds in all fields, but there is audible air entry. Crackles in the bilateral lower and midlung fields. Normal  expiratory phase, no audible wheezing.. Gastrointestinal:   Soft and nontender. No distention. There is no CVA tenderness.  No rebound, rigidity, or guarding. Genitourinary:   deferred Musculoskeletal:   Nontender with normal range of motion in all extremities. No joint effusions.  No lower extremity tenderness.  No edema. Neurologic:   Normal speech and language.  CN 2-10 normal. Motor grossly intact. No pronator drift.  Normal gait. No gross focal neurologic deficits are appreciated.  Skin:    Skin is warm, dry and intact. No rash noted.  No petechiae, purpura, or bullae. Psychiatric:   Mood and affect are normal. Speech and behavior are normal. Patient exhibits appropriate insight and judgment.  ____________________________________________    LABS (pertinent positives/negatives) (all labs ordered are listed, but only abnormal results are displayed) Labs Reviewed  BASIC METABOLIC PANEL  TROPONIN I  CBC WITH DIFFERENTIAL/PLATELET  BLOOD GAS, ARTERIAL   ____________________________________________   EKG  Interpreted by me Indeterminate rhythm, junctional versus sinus with small P waves. Heart rate is 60. Left axis, left bundle branch block. Otherwise normal intervals. No acute ischemic changes in the ST segments and T waves.  ____________________________________________    RADIOLOGY    ____________________________________________   PROCEDURES CRITICAL CARE Performed by: Carrie Mew   Total critical care time: 35 minutes  Critical care time was exclusive of separately billable procedures and treating other patients.  Critical care was necessary to treat or prevent imminent or life-threatening deterioration.  Critical care was time spent personally by me on the following activities: development of treatment plan with patient and/or surrogate as well as nursing, discussions with consultants, evaluation of patient's response to treatment, examination of patient,  obtaining history from patient or surrogate, ordering and performing treatments and interventions, ordering and review of laboratory studies, ordering and review of radiographic studies, pulse oximetry and re-evaluation of patient's condition.   ____________________________________________   INITIAL IMPRESSION / ASSESSMENT AND PLAN / ED COURSE  Pertinent labs & imaging results that were available during my care of the patient were reviewed by me and considered in my medical decision making (see chart for details).  Patient presents in severe respiratory distress. Based on history, quit weakness of onset, and exam, this appears to be a CHF exacerbation with pulmonary edema. Patient placed on BiPAP immediately upon arrival. ABG shows adequate oxygenation on 40% FiO2. She is hypercapnic with a PCO2 of 65. We'll give steroids and continue bronchodilator therapy as well. Also give Nitropaste to offload the lungs.  Pending the workup she will need admission.     ____________________________________________   FINAL  CLINICAL IMPRESSION(S) / ED DIAGNOSES  Final diagnoses:  Acute respiratory failure with hypoxia [facility])      Carrie Mew, MD 01/03/16 [id number]

## 2015-12-29 NOTE — H&P (Signed)
[facility] [place] NAME: Kylie Martinez    MR#:  [id number]  DATE OF BIRTH:  1936-03-28  DATE OF ADMISSION:  12/29/2015  PRIMARY CARE PHYSICIAN: Juluis Pitch, MD   REQUESTING/REFERRING PHYSICIAN: Dr. Jacqualine Code  CHIEF COMPLAINT:   Chief Complaint  Patient presents with  . Respiratory Distress    HISTORY OF PRESENT ILLNESS:  Kylie Martinez  is a 78 y.o. female with a known history of hypertension, diabetes, chronic respiratory failure, chronic systolic congestive heart failure with ejection fraction 45% is BiPAP dependent presents to the emergency room with worsening respiratory status. Patient was found to have saturations of 70% at the nursing home. Patient is unable to contribute to history due to her severe respiratory distress and also dementia. History obtained from reviewing old records and discussing with family at bedside. Chest x-ray showed congestive heart failure and bilateral pneumonia. WBC elevated at 19,000. Fever 100.4.  PAST MEDICAL HISTORY:   Past Medical History  Diagnosis Date  . Hypokalemia   . UTI (lower urinary tract infection)   . Vaginitis   . Cognitive communication deficit   . Pacemaker   . Sleep apnea   . Hypertension   . Chronic kidney disease   . Malignant neoplasm ([place]) skin of breast  . Diabetes mellitus without complication ([place])   . Spinal stenosis   . Chronic foot ulcer ([place])   . A-fib ([place])   . Aortic stenosis, moderate   . Dementia   . PAD (peripheral artery disease) ([place])   . Seizure ([place])     PAST SURGICAL HISTORY:  No past surgical history on file.  SOCIAL HISTORY:   Social History  Substance Use Topics  . Smoking status: Never Smoker   . Smokeless tobacco: Not on file  . Alcohol Use: Not on file    FAMILY HISTORY:   Family History  Problem Relation Age of Onset  . Diabetes Mother   . Heart failure Sister     DRUG ALLERGIES:   Allergies  Allergen Reactions   . Ace Inhibitors   . Dristan Cold [Chlorphen-Pe-Acetaminophen]   . Iodine   . Keppra [Levetiracetam]   . Latex   . Other     ARB-angiotension receptor antsgonist All topical AG treatment  . Statins     REVIEW OF SYSTEMS:   Review of Systems  Unable to perform ROS: dementia    MEDICATIONS AT HOME:   Prior to Admission medications   Medication Sig Start Date End Date Taking? Authorizing Provider  acidophilus (RISAQUAD) CAPS capsule Take 1 capsule by mouth daily.   Yes Historical Provider, MD  amLODipine (NORVASC) 10 MG tablet Take 10 mg by mouth daily.   Yes Historical Provider, MD  aspirin 81 MG chewable tablet Chew 81 mg by mouth daily.   Yes Historical Provider, MD  docusate sodium (COLACE) 100 MG capsule Take 100 mg by mouth 2 (two) times daily as needed for mild constipation.   Yes Historical Provider, MD  ferrous sulfate 325 (65 FE) MG tablet Take 325 mg by mouth daily.   Yes Historical Provider, MD  furosemide (LASIX) 20 MG tablet Take 20 mg by mouth 2 (two) times daily.   Yes Historical Provider, MD  gabapentin (NEURONTIN) 100 MG capsule Take 100 mg by mouth at bedtime.   Yes Historical Provider, MD  insulin NPH Human (HUMULIN N,NOVOLIN N) 100 UNIT/ML injection Inject 20-28 Units into the skin 2 (two) times daily. 28 units in  the morning and 20 units in the evening   Yes Historical Provider, MD  Multiple Vitamin (MULTIVITAMIN WITH MINERALS) TABS tablet Take 1 tablet by mouth daily.   Yes Historical Provider, MD  nitroGLYCERIN (NITROSTAT) 0.4 MG SL tablet Place 0.4 mg under the tongue every 5 (five) minutes as needed for chest pain.   Yes Historical Provider, MD  omega-3 fish oil (MAXEPA) 1000 MG CAPS capsule Take 1 capsule by mouth daily.   Yes Historical Provider, MD  Pollen Extracts (PROSTAT PO) Take 30 mLs by mouth 2 (two) times daily.   Yes Historical Provider, MD  polyethylene glycol (MIRALAX / GLYCOLAX) packet Take 17 g by mouth daily as needed for mild constipation.    Yes Historical Provider, MD  potassium chloride (KLOR-CON) 20 MEQ packet Take 20 mEq by mouth daily.   Yes Historical Provider, MD  vitamin C (ASCORBIC ACID) 500 MG tablet Take 500 mg by mouth daily.   Yes Historical Provider, MD      VITAL SIGNS:  Blood pressure 179/80, pulse 60, temperature 100.4 F (38 C), temperature source Rectal, resp. rate 26, height 5\' 5"  (1.651 m), weight 74.844 kg (165 lb), SpO2 96 %.  PHYSICAL EXAMINATION:  Physical Exam  GENERAL:  79 y.o.-year-old patient lying in the bed with respiratory distress on BiPAP. Looks critically ill. EYES: Pupils equal, round, reactive to light and accommodation. No scleral icterus. Extraocular muscles intact.  HEENT: Head atraumatic, normocephalic. Oropharynx and nasopharynx clear. No oropharyngeal erythema, moist oral mucosa  NECK:  Supple, no jugular venous distention. No thyroid enlargement, no tenderness.  LUNGS: Decreased air entry bilaterally. Bilateral wheezing. CARDIOVASCULAR: S1, S2 normal. No murmurs, rubs, or gallops.  ABDOMEN: Soft, nontender, nondistended. Bowel sounds present. No organomegaly or mass.  EXTREMITIES: No pedal edema, cyanosis, or clubbing. + 2 pedal & radial pulses b/l.   NEUROLOGIC: Cranial nerves II through XII are intact. No focal Motor or sensory deficits appreciated b/l PSYCHIATRIC: The patient is drowsy. SKIN: No obvious rash. Foot ulcer  LABORATORY PANEL:   CBC  Recent Labs Lab 12/29/15 2240  WBC 19.8*  HGB 10.8*  HCT 33.4*  PLT 232   ------------------------------------------------------------------------------------------------------------------  Chemistries   Recent Labs Lab 12/29/15 2240  NA 140  K 4.4  CL 99*  CO2 32  GLUCOSE 392*  BUN 64*  CREATININE 1.75*  CALCIUM 8.9   ------------------------------------------------------------------------------------------------------------------  Cardiac Enzymes  Recent Labs Lab 12/29/15 2240  TROPONINI 0.06*    ------------------------------------------------------------------------------------------------------------------  RADIOLOGY:  Dg Chest Port 1 View  12/29/2015  CLINICAL DATA:  Acute onset of respiratory distress. Initial encounter. EXAM: PORTABLE CHEST 1 VIEW COMPARISON:  None. FINDINGS: The lungs are well-aerated. Small bilateral pleural effusions are seen. Vascular congestion is noted, with increased interstitial markings, concerning for pulmonary edema. There is no evidence of pneumothorax. The cardiomediastinal silhouette is enlarged. A pacemaker is noted overlying the left chest wall, with a single lead partially imaged. No acute osseous abnormalities are seen. IMPRESSION: Vascular congestion and cardiomegaly, with increased interstitial markings, concerning for pulmonary edema. Small bilateral pleural effusions seen. Electronically Signed   By: Garald Balding M.D.   On: 12/29/2015 22:54     IMPRESSION AND PLAN:   * Acute on chronic respiratory failure due to CHF and pneumonia Place on BiPAP. Patient seems to be BiPAP dependent even at the nursing home. Discussed with her daughter who wishes for the patient to be full code. Will need intubation and full when support if any worsening.  * Acute on  chronic systolic CHF with ejection fraction of 45% Has received 1 dose of IV Lasix in the emergency room. We'll continue Lasix IV 40 mg 2 times a day. Monitor input output and daily weight.  * Bilateral healthcare acquired pneumonia Start IV Zosyn, vancomycin and azithromycin. Check Streptococcus and Legionella urine antigen. Sputum cultures and blood cultures.  * CKD stage III Her baseline creatinine seems to be around 2. Now it is 1.75. Will likely worsen with diuresis.  * Hypertension Continue home medications  * Insulin-dependent diabetes mellitus Continue her NPH insulin. Start sliding scale insulin. Blood sugars will likely be uncontrolled due to being on IV steroids.  *  Dementia Watch for inpatient delirium. Daughter Jaimelyn Pleva is her healthcare power of attorney. Phone number is [phone]  * DVT prophylaxis with renally dosed Lovenox  * Chronic foot ulcer Continue wound care  Initially family requested that patient be transferred to [facility]. The main concern being lack of access to [facility] healthcare records and lack of communication. I have assured the family that we have ICU, critical care specialist and cardiologist available and would be able to take care of the patient here at [facility]. Also her medical records from [facility] are available.   All the records are reviewed and case discussed with ED provider. Management plans discussed with the patient, family and they are in agreement.  CODE STATUS: FULL  TOTAL CRITICAL CARE TIME TAKING CARE OF THIS PATIENT: 50 minutes.    Hillary Bow R M.D on 12/29/2015 at 11:53 PM  Between 7am to 6pm - Pager - [phone]  After 6pm go to [email or web address] - password [email or web address] [place] [facility]  Office  [phone]  CC: Primary care physician; Juluis Pitch, MD     Note: This dictation was prepared with Dragon dictation along with smaller phrase technology. Any transcriptional errors that result from this process are unintentional.

## 2015-12-30 DIAGNOSIS — J9600 Acute respiratory failure, unspecified whether with hypoxia or hypercapnia: Secondary | ICD-10-CM

## 2015-12-30 DIAGNOSIS — J189 Pneumonia, unspecified organism: Secondary | ICD-10-CM

## 2015-12-30 DIAGNOSIS — I5020 Unspecified systolic (congestive) heart failure: Secondary | ICD-10-CM

## 2015-12-30 LAB — CBC
HCT: 32.1 % — ABNORMAL LOW (ref 35.0–47.0)
HEMOGLOBIN: 10.2 g/dL — AB (ref 12.0–16.0)
MCH: 28.5 pg (ref 26.0–34.0)
MCHC: 31.7 g/dL — ABNORMAL LOW (ref 32.0–36.0)
MCV: 89.9 fL (ref 80.0–100.0)
Platelets: 191 10*3/uL (ref 150–440)
RBC: 3.57 MIL/uL — ABNORMAL LOW (ref 3.80–5.20)
RDW: 18.8 % — ABNORMAL HIGH (ref 11.5–14.5)
WBC: 20.3 10*3/uL — ABNORMAL HIGH (ref 3.6–11.0)

## 2015-12-30 LAB — GLUCOSE, CAPILLARY
GLUCOSE-CAPILLARY: 310 mg/dL — AB (ref 65–99)
GLUCOSE-CAPILLARY: 446 mg/dL — AB (ref 65–99)
GLUCOSE-CAPILLARY: 356 mg/dL — AB (ref 65–99)
GLUCOSE-CAPILLARY: 282 mg/dL — AB (ref 65–99)
GLUCOSE-CAPILLARY: 187 mg/dL — AB (ref 65–99)
GLUCOSE-CAPILLARY: 158 mg/dL — AB (ref 65–99)
Glucose-Capillary: 332 mg/dL — ABNORMAL HIGH (ref 65–99)

## 2015-12-30 LAB — LACTIC ACID, PLASMA: Lactic Acid, Venous: 1.1 mmol/L (ref 0.5–2.0)

## 2015-12-30 LAB — INFLUENZA PANEL BY PCR (TYPE A & B)
H1N1 flu by pcr: NOT DETECTED
INFLAPCR: NEGATIVE
INFLBPCR: NEGATIVE

## 2015-12-30 LAB — MRSA PCR SCREENING: MRSA BY PCR: NEGATIVE

## 2015-12-30 LAB — BASIC METABOLIC PANEL
ANION GAP: 5 (ref 5–15)
BUN: 66 mg/dL — ABNORMAL HIGH (ref 6–20)
CALCIUM: 8.5 mg/dL — AB (ref 8.9–10.3)
CO2: 33 mmol/L — AB (ref 22–32)
Chloride: 101 mmol/L (ref 101–111)
Creatinine, Ser: 1.83 mg/dL — ABNORMAL HIGH (ref 0.44–1.00)
GFR calc Af Amer: 30 mL/min — ABNORMAL LOW (ref >60)
GFR, EST NON AFRICAN AMERICAN: 25 mL/min — AB (ref >60)
GLUCOSE: 333 mg/dL — AB (ref 65–99)
Potassium: 4.2 mmol/L (ref 3.5–5.1)
Sodium: 139 mmol/L (ref 135–145)

## 2015-12-30 LAB — STREP PNEUMONIAE URINARY ANTIGEN: STREP PNEUMO URINARY ANTIGEN: NEGATIVE

## 2015-12-30 LAB — TROPONIN I: Troponin I: 0.09 ng/mL — ABNORMAL HIGH (ref <0.031)

## 2015-12-30 MED ORDER — AZITHROMYCIN 250 MG PO TABS
500.0000 mg | ORAL_TABLET | Freq: Every day | ORAL | Status: DC
  Administered 2015-12-31 – 2016-01-01 (×2): 500 mg via ORAL
  Filled 2015-12-30 (×2): qty 2

## 2015-12-30 MED ORDER — INSULIN NPH (HUMAN) (ISOPHANE) 100 UNIT/ML ~~LOC~~ SUSP: 20.0000 [IU] | Freq: Two times a day (BID) | SUBCUTANEOUS | Status: DC

## 2015-12-30 MED ORDER — INSULIN ASPART 100 UNIT/ML ~~LOC~~ SOLN
22.0000 [IU] | Freq: Once | SUBCUTANEOUS | Status: AC
  Administered 2015-12-30: 22 [IU] via SUBCUTANEOUS
  Filled 2015-12-30: qty 22

## 2015-12-30 MED ORDER — AMLODIPINE BESYLATE 10 MG PO TABS
10.0000 mg | ORAL_TABLET | Freq: Every day | ORAL | Status: DC
  Administered 2015-12-30 – 2016-01-08 (×10): 10 mg via ORAL
  Filled 2015-12-30 (×10): qty 1

## 2015-12-30 MED ORDER — RISAQUAD PO CAPS
1.0000 | ORAL_CAPSULE | Freq: Every day | ORAL | Status: DC
Start: 2015-12-30 — End: 2016-01-08
  Administered 2015-12-30 – 2016-01-08 (×10): 1 via ORAL
  Filled 2015-12-30 (×10): qty 1

## 2015-12-30 MED ORDER — FERROUS SULFATE 325 (65 FE) MG PO TABS
325.0000 mg | ORAL_TABLET | Freq: Every day | ORAL | Status: DC
  Administered 2015-12-30 – 2016-01-08 (×10): 325 mg via ORAL
  Filled 2015-12-30 (×10): qty 1

## 2015-12-30 MED ORDER — GABAPENTIN 100 MG PO CAPS
100.0000 mg | ORAL_CAPSULE | Freq: Every day | ORAL | Status: DC
  Administered 2015-12-30 – 2016-01-07 (×9): 100 mg via ORAL
  Filled 2015-12-30 (×9): qty 1

## 2015-12-30 MED ORDER — CHLORHEXIDINE GLUCONATE 0.12 % MT SOLN
15.0000 mL | Freq: Two times a day (BID) | OROMUCOSAL | Status: DC
  Administered 2015-12-30 – 2016-01-08 (×19): 15 mL via OROMUCOSAL
  Filled 2015-12-30 (×18): qty 15

## 2015-12-30 MED ORDER — POLYETHYLENE GLYCOL 3350 17 G PO PACK: 17.0000 g | PACK | Freq: Every day | ORAL | Status: DC | PRN

## 2015-12-30 MED ORDER — POTASSIUM CHLORIDE 20 MEQ PO PACK
20.0000 meq | PACK | Freq: Every day | ORAL | Status: DC
  Administered 2015-12-30 – 2015-12-31 (×2): 20 meq via ORAL
  Filled 2015-12-30 (×2): qty 1

## 2015-12-30 MED ORDER — PIPERACILLIN-TAZOBACTAM 3.375 G IVPB
3.3750 g | Freq: Three times a day (TID) | INTRAVENOUS | Status: DC
  Administered 2015-12-30 – 2015-12-31 (×5): 3.375 g via INTRAVENOUS
  Filled 2015-12-30 (×6): qty 50

## 2015-12-30 MED ORDER — CETYLPYRIDINIUM CHLORIDE 0.05 % MT LIQD
7.0000 mL | Freq: Two times a day (BID) | OROMUCOSAL | Status: DC
  Administered 2015-12-30 – 2016-01-06 (×10): 7 mL via OROMUCOSAL

## 2015-12-30 MED ORDER — INSULIN DETEMIR 100 UNIT/ML ~~LOC~~ SOLN
20.0000 [IU] | Freq: Every day | SUBCUTANEOUS | Status: DC
  Administered 2015-12-30 – 2016-01-01 (×3): 20 [IU] via SUBCUTANEOUS
  Filled 2015-12-30 (×4): qty 0.2

## 2015-12-30 MED ORDER — INSULIN DETEMIR 100 UNIT/ML ~~LOC~~ SOLN
28.0000 [IU] | Freq: Every morning | SUBCUTANEOUS | Status: DC
  Administered 2015-12-30 – 2016-01-01 (×3): 28 [IU] via SUBCUTANEOUS
  Filled 2015-12-30 (×4): qty 0.28

## 2015-12-30 MED ORDER — NITROGLYCERIN 0.4 MG SL SUBL: 0.4000 mg | SUBLINGUAL_TABLET | SUBLINGUAL | Status: DC | PRN

## 2015-12-30 MED ORDER — VANCOMYCIN HCL IN DEXTROSE 1-5 GM/200ML-% IV SOLN
1000.0000 mg | INTRAVENOUS | Status: DC
  Administered 2015-12-30: 1000 mg via INTRAVENOUS
  Filled 2015-12-30: qty 200

## 2015-12-30 NOTE — Progress Notes (Signed)
[place] [facility] [place] NAME: Kylie Martinez    MR#:  [id number]  DATE OF BIRTH:  1936/01/24  SUBJECTIVE:  CHIEF COMPLAINT:   Chief Complaint  Patient presents with  . Respiratory Distress   the patient is 80 year old Caucasian female with history of Alzheimer's dementia who presents to the hospital with shortness of breath, hypoxia with O2 sats in the 70s. Chest x-ray revealed congestive heart failure with suspected underlying pneumonia since patient had fever and leukocytosis to 19,000 as well. Patient was diuresed and initiated on broad-spectrum antibiotic therapy to cover healthcare associated pneumonia. She feels better today. Denies shortness of breath or pain  Review of Systems  Unable to perform ROS: dementia    VITAL SIGNS: Blood pressure 143/66, pulse 59, temperature 98.6 F (37 C), temperature source Oral, resp. rate 18, height 5\' 5"  (1.651 m), weight 74.844 kg (165 lb), SpO2 97 %.  PHYSICAL EXAMINATION:   GENERAL:  80 y.o.-year-old patient lying in the bed with no acute distress.  EYES: Pupils equal, round, reactive to light and accommodation. No scleral icterus. Extraocular muscles intact.  HEENT: Head atraumatic, normocephalic. Oropharynx and nasopharynx clear.  NECK:  Supple, no jugular venous distention. No thyroid enlargement, no tenderness.  LUNGS: Normal breath sounds bilaterally, no wheezing, rales,rhonchi . No use of accessory muscles of respiration. Crackles at bases posteriorly CARDIOVASCULAR: S1, S2 normal. 4/6 systolic ejection murmur noted, but no rubs, or gallops.  ABDOMEN: Soft, nontender, nondistended. Bowel sounds present. No organomegaly or mass.  EXTREMITIES: No pedal edema, cyanosis, or clubbing.  NEUROLOGIC: Cranial nerves II through XII are intact. Muscle strength 5/5 in all extremities. Sensation intact. Gait not checked.  PSYCHIATRIC: The patient is alert and oriented x 3.  SKIN: No obvious rash,  lesion, or ulcer.   ORDERS/RESULTS REVIEWED:   CBC  Recent Labs Lab 12/29/15 2240 12/30/15 0326  WBC 19.8* 20.3*  HGB 10.8* 10.2*  HCT 33.4* 32.1*  PLT 232 191  MCV 90.5 89.9  MCH 29.2 28.5  MCHC 32.3 31.7*  RDW 18.7* 18.8*  LYMPHSABS 0.8*  --   MONOABS 0.9  --   EOSABS 0.1  --   BASOSABS 0.0  --    ------------------------------------------------------------------------------------------------------------------  Chemistries   Recent Labs Lab 12/29/15 2240 12/30/15 0326  NA 140 139  K 4.4 4.2  CL 99* 101  CO2 32 33*  GLUCOSE 392* 333*  BUN 64* 66*  CREATININE 1.75* 1.83*  CALCIUM 8.9 8.5*   ------------------------------------------------------------------------------------------------------------------ estimated creatinine clearance is 26.1 mL/min (by C-G formula based on Cr of 1.83). ------------------------------------------------------------------------------------------------------------------ No results for input(s): TSH, T4TOTAL, T3FREE, THYROIDAB in the last 72 hours.  Invalid input(s): FREET3  Cardiac Enzymes  Recent Labs Lab 12/29/15 2240 12/30/15 0326  TROPONINI 0.06* 0.09*   ------------------------------------------------------------------------------------------------------------------ Invalid input(s): POCBNP ---------------------------------------------------------------------------------------------------------------  RADIOLOGY: Dg Chest Port 1 View  12/29/2015  CLINICAL DATA:  Acute onset of respiratory distress. Initial encounter. EXAM: PORTABLE CHEST 1 VIEW COMPARISON:  None. FINDINGS: The lungs are well-aerated. Small bilateral pleural effusions are seen. Vascular congestion is noted, with increased interstitial markings, concerning for pulmonary edema. There is no evidence of pneumothorax. The cardiomediastinal silhouette is enlarged. A pacemaker is noted overlying the left chest wall, with a single lead partially imaged. No acute  osseous abnormalities are seen. IMPRESSION: Vascular congestion and cardiomegaly, with increased interstitial markings, concerning for pulmonary edema. Small bilateral pleural effusions seen. Electronically Signed   By: Garald Balding M.D.   On: 12/29/2015  22:54    EKG:  Orders placed or performed during the hospital encounter of 12/29/15  . ED EKG  . ED EKG  . EKG 12-Lead  . EKG 12-Lead    ASSESSMENT AND PLAN:  Active Problems:   CHF (congestive heart failure) ([place]) 1. Acute systolic congestive heart failure with acute pulmonary edema, continue diuresis with Lasix intravenously, follow ins and outs, as well as weight 2. Bacterial pneumonia, healthcare associated, continue broad-spectrum antibiotics with Zithromax Vanco and Zosyn, get sputum cultures if possible. Blood cultures negative so far 3. Elevated troponin, likely demand ischemia. Echocardiogram in May 2016 revealed LVH, ejection fraction of 45-50%, aortic stenosis, elevated central venous in the right atrial pressures. Estimated aortic valve area was 1.3-1.5 square cm 4 . Renal insufficiency, watch with diuresis 5. Leukocytosis, seems to be stable despite antibiotic therapy, likely steroid related   Management plans discussed with the patient, family and they are in agreement.   DRUG ALLERGIES:  Allergies  Allergen Reactions  . Ace Inhibitors   . Dristan Cold [Chlorphen-Pe-Acetaminophen]   . Iodine   . Keppra [Levetiracetam]   . Latex   . Other     ARB-angiotension receptor antsgonist All topical AG treatment  . Silver     silverdeen cream  . Statins     CODE STATUS:     Code Status Orders        Start     Ordered   12/29/15 2348  Full code   Continuous     12/29/15 2349    Advance Directive Documentation        Most Recent Value   Type of Advance Directive  Out of facility DNR (pink MOST or yellow form)   Pre-existing out of facility DNR order (yellow form or pink MOST form)     "MOST" Form in Place?         TOTAL TIME TAKING CARE OF THIS PATIENT: 45 minutes.  Discussed with patient's family, treatment plan and discharge planning. All questions were answered. Time spent approximately 15 minutes. On discussions  Wren Gallaga M.D on 12/30/2015 at 1:08 PM  Between 7am to 6pm - Pager - [phone]  After 6pm go to [email or web address] - password [email or web address] [place] [facility]  Office  [phone]  CC: Primary care physician; Juluis Pitch, MD

## 2015-12-30 NOTE — Progress Notes (Signed)
Per Ms Kattner's daughter/hcpoa, her bipap settings per her neurologist is to be 20/17 no o2. States these are the settings for her home unit. Changed settings to such. However placed patient on 25% o2 at this time. Her o2 saturation is 92%. Patient is more alert now. Will continue to monitor

## 2015-12-30 NOTE — H&P (Signed)
Pulmonary Consult   PATIENT NAME: Kylie Martinez    MR#:  [id number]  DATE OF BIRTH:  [birth year]  DATE OF ADMISSION:  12/29/2015  PRIMARY CARE PHYSICIAN: Juluis Pitch, MD   REQUESTING/REFERRING PHYSICIAN:[place]  CHIEF COMPLAINT:   Chief Complaint  Patient presents with  . Respiratory Distress    HISTORY OF PRESENT ILLNESS:  Kylie Martinez  is a 80 y.o. female with a multiple medical issues seen today for acute SOB, pateint with known history of hypertension, diabetes, chronic respiratory failure, chronic systolic congestive heart failure with ejection fraction 45% is BiPAP dependent at [facility] presents to the emergency room with worsening respiratory status.   Patient was found to have saturations of 70% at the nursing home. Patient alert and awake, NAD.  Resting comfortably on minimal oxygen, patient has no acute complaints at this time Slightly confused from her dementia, Denies fevers, chills, no CP,SOB  Images reviewed 12/30/2015 Chest x-ray showed b /l opacities.   PAST MEDICAL HISTORY:   Past Medical History  Diagnosis Date  . Hypokalemia   . UTI (lower urinary tract infection)   . Vaginitis   . Cognitive communication deficit   . Pacemaker   . Sleep apnea   . Hypertension   . Chronic kidney disease   . Malignant neoplasm ([place]) skin of breast  . Diabetes mellitus without complication ([place])   . Spinal stenosis   . Chronic foot ulcer ([place])   . A-fib ([place])   . Aortic stenosis, moderate   . Dementia   . PAD (peripheral artery disease) ([place])   . Seizure ([place])     PAST SURGICAL HISTORY:  No past surgical history on file.  SOCIAL HISTORY:   Social History  Substance Use Topics  . Smoking status: Never Smoker   . Smokeless tobacco: Not on file  . Alcohol Use: Not on file    FAMILY HISTORY:   Family History  Problem Relation Age of Onset  . Diabetes Mother   . Heart failure Sister     DRUG ALLERGIES:   Allergies  Allergen Reactions  .  Ace Inhibitors   . Dristan Cold [Chlorphen-Pe-Acetaminophen]   . Iodine   . Keppra [Levetiracetam]   . Latex   . Other     ARB-angiotension receptor antsgonist All topical AG treatment  . Silver     silverdeen cream  . Statins     REVIEW OF SYSTEMS:   Review of Systems  Unable to perform ROS: dementia  Constitutional: Negative for fever, chills and weight loss.  Eyes: Negative for blurred vision.  Respiratory: Negative for shortness of breath and wheezing.   Cardiovascular: Negative for chest pain.  Gastrointestinal: Negative for nausea, vomiting and abdominal pain.  Genitourinary: Negative for flank pain.  Musculoskeletal: Negative.   Skin: Negative for rash.  Neurological: Negative for headaches.  All other systems reviewed and are negative.   MEDICATIONS AT HOME:   Prior to Admission medications   Medication Sig Start Date End Date Taking? Authorizing Provider  acidophilus (RISAQUAD) CAPS capsule Take 1 capsule by mouth daily.   Yes Historical Provider, MD  amLODipine (NORVASC) 10 MG tablet Take 10 mg by mouth daily.   Yes Historical Provider, MD  aspirin 81 MG chewable tablet Chew 81 mg by mouth daily.   Yes Historical Provider, MD  docusate sodium (COLACE) 100 MG capsule Take 100 mg by mouth 2 (two) times daily as needed for mild constipation.   Yes Historical Provider, MD  ferrous sulfate 325 (  65 FE) MG tablet Take 325 mg by mouth daily.   Yes Historical Provider, MD  furosemide (LASIX) 20 MG tablet Take 20 mg by mouth 2 (two) times daily.   Yes Historical Provider, MD  gabapentin (NEURONTIN) 100 MG capsule Take 100 mg by mouth at bedtime.   Yes Historical Provider, MD  insulin NPH Human (HUMULIN N,NOVOLIN N) 100 UNIT/ML injection Inject 20-28 Units into the skin 2 (two) times daily. 28 units in the morning and 20 units in the evening   Yes Historical Provider, MD  Multiple Vitamin (MULTIVITAMIN WITH MINERALS) TABS tablet Take 1 tablet by mouth daily.   Yes Historical  Provider, MD  nitroGLYCERIN (NITROSTAT) 0.4 MG SL tablet Place 0.4 mg under the tongue every 5 (five) minutes as needed for chest pain.   Yes Historical Provider, MD  omega-3 fish oil (MAXEPA) 1000 MG CAPS capsule Take 1 capsule by mouth daily.   Yes Historical Provider, MD  Pollen Extracts (PROSTAT PO) Take 30 mLs by mouth 2 (two) times daily.   Yes Historical Provider, MD  polyethylene glycol (MIRALAX / GLYCOLAX) packet Take 17 g by mouth daily as needed for mild constipation.   Yes Historical Provider, MD  potassium chloride (KLOR-CON) 20 MEQ packet Take 20 mEq by mouth daily.   Yes Historical Provider, MD  vitamin C (ASCORBIC ACID) 500 MG tablet Take 500 mg by mouth daily.   Yes Historical Provider, MD      VITAL SIGNS:  Blood pressure 150/58, pulse 60, temperature 97.9 F (36.6 C), temperature source Axillary, resp. rate 19, height 5\' 5"  (1.651 m), weight 165 lb (74.844 kg), SpO2 96 %.  PHYSICAL EXAMINATION:  Physical Exam  GENERAL:  80 y.o.-year-old patient lying in the bed comfortably. EYES: Pupils equal, round, reactive to light and accommodation. No scleral icterus. Extraocular muscles intact.  HEENT: Head atraumatic, normocephalic. Oropharynx and nasopharynx clear. No oropharyngeal erythema, moist oral mucosa  NECK:  Supple, no jugular venous distention. No thyroid enlargement, no tenderness.  LUNGS: no wheezing, BS b/l CARDIOVASCULAR: S1, S2 normal. No murmurs, rubs, or gallops.  ABDOMEN: Soft, nontender, nondistended. Bowel sounds present. No organomegaly or mass.  EXTREMITIES: No pedal edema, cyanosis, or clubbing. + 2 pedal & radial pulses b/l.   NEUROLOGIC: Cranial nerves II through XII are intact. No focal Motor or sensory deficits appreciated b/l   LABORATORY PANEL:   CBC  Recent Labs Lab 12/30/15 0326  WBC 20.3*  HGB 10.2*  HCT 32.1*  PLT 191    ------------------------------------------------------------------------------------------------------------------  Chemistries   Recent Labs Lab 12/30/15 0326  NA 139  K 4.2  CL 101  CO2 33*  GLUCOSE 333*  BUN 66*  CREATININE 1.83*  CALCIUM 8.5*   ------------------------------------------------------------------------------------------------------------------  Cardiac Enzymes  Recent Labs Lab 12/30/15 0326  TROPONINI 0.09*   ------------------------------------------------------------------------------------------------------------------  RADIOLOGY:  Dg Chest Port 1 View  12/29/2015  CLINICAL DATA:  Acute onset of respiratory distress. Initial encounter. EXAM: PORTABLE CHEST 1 VIEW COMPARISON:  None. FINDINGS: The lungs are well-aerated. Small bilateral pleural effusions are seen. Vascular congestion is noted, with increased interstitial markings, concerning for pulmonary edema. There is no evidence of pneumothorax. The cardiomediastinal silhouette is enlarged. A pacemaker is noted overlying the left chest wall, with a single lead partially imaged. No acute osseous abnormalities are seen. IMPRESSION: Vascular congestion and cardiomegaly, with increased interstitial markings, concerning for pulmonary edema. Small bilateral pleural effusions seen. Electronically Signed   By: Garald Balding M.D.   On: 12/29/2015 22:54  IMPRESSION AND PLAN:  80 yo white female with acute resp failure from acute CHF exacerbation from probable acute pneumonia  1. Acute on chronic respiratory failure due to CHF and pneumonia-resolving -bipap as needed during the day and at night -continue iv abx as prescribed  2.Acute on chronic systolic CHF with ejection fraction of 45% Has received 1 dose of IV Lasix in the emergency room.  -continue Lasix IV 40 mg 2 times a day.   3.Bilateral healthcare acquired pneumonia Start IV Zosyn, vancomycin and azithromycin.  -Check Streptococcus and  Legionella urine antigen. Sputum cultures and blood cultures.   4. Insulin-dependent diabetes mellitus Continue her NPH insulin. Start sliding scale insulin.  -Blood sugars will likely be uncontrolled due to being on IV steroids.  5.Dementia Watch for inpatient delirium. Daughter Phylliss Manieri is her healthcare power of attorney. Phone number is [phone]  6.DVT prophylaxis with renally dosed Lovenox   I have personally obtained a history, examined the patient, evaluated Pertinent laboratory and RadioGraphic/imaging results, and  formulated the assessment and plan The Patient requires high complexity decision making for assessment and support, frequent evaluation and titration of therapies. Patient/family  satisfied with Plan of action and management. All questions answered  Corrin Parker, M.D.  [facility]  Medical Director [place] Director [facility] Cardio-Pulmonary Department

## 2015-12-30 NOTE — ED Notes (Signed)
Per [facility] @ lab, the PCR Influenza test should have been ordered, not the A/B Antigen one.  D/C the old one and putting in the new one.

## 2015-12-30 NOTE — ED Notes (Signed)
MD at bedside. 

## 2015-12-30 NOTE — Progress Notes (Signed)
I called Dr. Ether Griffins regarding blood sugar of 446. New orders received

## 2015-12-30 NOTE — Progress Notes (Signed)
ANTIBIOTIC CONSULT NOTE - INITIAL  Pharmacy Consult for vancomycin and Zosyn dosing Indication: pneumonia  Allergies  Allergen Reactions  . Ace Inhibitors   . Dristan Cold [Chlorphen-Pe-Acetaminophen]   . Iodine   . Keppra [Levetiracetam]   . Latex   . Other     ARB-angiotension receptor antsgonist All topical AG treatment  . Silver     silverdeen cream  . Statins     Patient Measurements: Height: 5\' 5"  (165.1 cm) Weight: 165 lb (74.844 kg) IBW/kg (Calculated) : 57 Adjusted Body Weight: 64kg  Vital Signs: Temp: 98.4 F (36.9 C) (01/01 0200) Temp Source: Axillary (01/01 0200) BP: 137/54 mmHg (01/01 0400) Pulse Rate: 59 (01/01 0400) Intake/Output from previous day: 12/31 0701 - 01/01 0700 In: 400 [IV Piggyback:400] Out: 150 [Urine:150] Intake/Output from this shift: Total I/O In: 400 [IV Piggyback:400] Out: 150 [Urine:150]  Labs:  Recent Labs  12/29/15 2240 12/30/15 0326  WBC 19.8* 20.3*  HGB 10.8* 10.2*  PLT 232 191  CREATININE 1.75* 1.83*   Estimated Creatinine Clearance: 26.1 mL/min (by C-G formula based on Cr of 1.83). No results for input(s): VANCOTROUGH, VANCOPEAK, VANCORANDOM, GENTTROUGH, GENTPEAK, GENTRANDOM, TOBRATROUGH, TOBRAPEAK, TOBRARND, AMIKACINPEAK, AMIKACINTROU, AMIKACIN in the last 72 hours.   Microbiology: Recent Results (from the past 720 hour(s))  MRSA PCR Screening     Status: None   Collection Time: 12/30/15  1:52 AM  Result Value Ref Range Status   MRSA by PCR NEGATIVE NEGATIVE Final    Comment:        The GeneXpert MRSA Assay (FDA approved for NASAL specimens only), is one component of a comprehensive MRSA colonization surveillance program. It is not intended to diagnose MRSA infection nor to guide or monitor treatment for MRSA infections.     Medical History: Past Medical History  Diagnosis Date  . Hypokalemia   . UTI (lower urinary tract infection)   . Vaginitis   . Cognitive communication deficit   . Pacemaker    . Sleep apnea   . Hypertension   . Chronic kidney disease   . Malignant neoplasm ([place]) skin of breast  . Diabetes mellitus without complication ([place])   . Spinal stenosis   . Chronic foot ulcer ([place])   . A-fib ([place])   . Aortic stenosis, moderate   . Dementia   . PAD (peripheral artery disease) ([place])   . Seizure ([place])     Medications:   Assessment: Blood and sputum cx pending CXR: pulm. edema  Goal of Therapy:  Vancomycin trough level 15-20 mcg/ml  Plan:  TBW 74.8kg  IBW 57kg  DW 64kg  Vd 45L kei 0.027 hr-1  T1/2 26 hours Vancomycin 1 gram q 36 hours ordered with stacked dosing.  Level before 4th dose.  Zosyn 3.375 grams q 8 hours ordered.  Marcena Dias S 12/30/2015,4:20 AM

## 2015-12-31 DIAGNOSIS — J9610 Chronic respiratory failure, unspecified whether with hypoxia or hypercapnia: Secondary | ICD-10-CM

## 2015-12-31 LAB — CBC
HCT: 31.4 % — ABNORMAL LOW (ref 35.0–47.0)
HEMOGLOBIN: 10.1 g/dL — AB (ref 12.0–16.0)
MCH: 29.2 pg (ref 26.0–34.0)
MCHC: 32.3 g/dL (ref 32.0–36.0)
MCV: 90.6 fL (ref 80.0–100.0)
Platelets: 179 10*3/uL (ref 150–440)
RBC: 3.47 MIL/uL — ABNORMAL LOW (ref 3.80–5.20)
RDW: 18.8 % — AB (ref 11.5–14.5)
WBC: 16.4 10*3/uL — AB (ref 3.6–11.0)

## 2015-12-31 LAB — BASIC METABOLIC PANEL
ANION GAP: 8 (ref 5–15)
BUN: 88 mg/dL — ABNORMAL HIGH (ref 6–20)
CALCIUM: 8.3 mg/dL — AB (ref 8.9–10.3)
CO2: 32 mmol/L (ref 22–32)
Chloride: 97 mmol/L — ABNORMAL LOW (ref 101–111)
Creatinine, Ser: 3.06 mg/dL — ABNORMAL HIGH (ref 0.44–1.00)
GFR, EST AFRICAN AMERICAN: 16 mL/min — AB (ref >60)
GFR, EST NON AFRICAN AMERICAN: 14 mL/min — AB (ref >60)
GLUCOSE: 162 mg/dL — AB (ref 65–99)
POTASSIUM: 5.5 mmol/L — AB (ref 3.5–5.1)
SODIUM: 137 mmol/L (ref 135–145)

## 2015-12-31 LAB — GLUCOSE, CAPILLARY
GLUCOSE-CAPILLARY: 144 mg/dL — AB (ref 65–99)
GLUCOSE-CAPILLARY: 281 mg/dL — AB (ref 65–99)
GLUCOSE-CAPILLARY: 323 mg/dL — AB (ref 65–99)
Glucose-Capillary: 366 mg/dL — ABNORMAL HIGH (ref 65–99)

## 2015-12-31 LAB — LEGIONELLA PNEUMOPHILA SEROGP 1 UR AG: L. PNEUMOPHILA SEROGP 1 UR AG: NEGATIVE

## 2015-12-31 MED ORDER — FUROSEMIDE 10 MG/ML IJ SOLN: 40.0000 mg | Freq: Every day | INTRAMUSCULAR | Status: DC

## 2015-12-31 MED ORDER — VANCOMYCIN HCL IN DEXTROSE 1-5 GM/200ML-% IV SOLN
1000.0000 mg | INTRAVENOUS | Status: DC
  Filled 2015-12-31: qty 200

## 2015-12-31 MED ORDER — PIPERACILLIN-TAZOBACTAM 3.375 G IVPB
3.3750 g | Freq: Two times a day (BID) | INTRAVENOUS | Status: DC
  Administered 2015-12-31 – 2016-01-01 (×2): 3.375 g via INTRAVENOUS
  Filled 2015-12-31 (×3): qty 50

## 2015-12-31 NOTE — Progress Notes (Signed)
[facility] [place] NAME: Kylie Martinez    MR#:  [id number]  DATE OF BIRTH:  Aug 05, 1936  SUBJECTIVE:  CHIEF COMPLAINT:   Chief Complaint  Patient presents with  . Respiratory Distress   the patient is 80 year old Caucasian female with history of Alzheimer's dementia who presents to the hospital with shortness of breath, hypoxia with O2 sats in the 70s. Chest x-ray revealed congestive heart failure with suspected underlying pneumonia since patient had fever and leukocytosis to 19,000 as well. Patient was diuresed and initiated on broad-spectrum antibiotic therapy to cover healthcare associated pneumonia. She feels relatively good today. Denies shortness of breath or pain, remains on 2 L of oxygen through nasal cannula, but being weaned to room air, still has some wheezes  Review of Systems  Unable to perform ROS: dementia    VITAL SIGNS: Blood pressure 135/57, pulse 59, temperature 99.2 F (37.3 C), temperature source Oral, resp. rate 26, height 5\' 5"  (1.651 m), weight 98.4 kg (216 lb 14.9 oz), SpO2 100 %.  PHYSICAL EXAMINATION:   GENERAL:  80 y.o.-year-old patient lying in the bed with no acute distress. Receiving nebulizing therapy via mask EYES: Pupils equal, round, reactive to light and accommodation. No scleral icterus. Extraocular muscles intact.  HEENT: Head atraumatic, normocephalic. Oropharynx and nasopharynx clear.  NECK:  Supple, no jugular venous distention. No thyroid enlargement, no tenderness.  LUNGS: Normal breath sounds bilaterally, scattered wheezing, no rales,rhonchi . No use of accessory muscles of respiration. Crackles at bases posteriorly have resolved CARDIOVASCULAR: S1, S2 normal. 4/6 systolic ejection murmur noted, but no rubs, or gallops.  ABDOMEN: Soft, nontender, nondistended. Bowel sounds present. No organomegaly or mass.  EXTREMITIES: No pedal edema, cyanosis, or clubbing.  NEUROLOGIC: Cranial nerves II  through XII are intact. Muscle strength 5/5 in all extremities. Sensation intact. Gait not checked.  PSYCHIATRIC: The patient is alert and oriented x 3.  SKIN: No obvious rash, lesion, or ulcer.   ORDERS/RESULTS REVIEWED:   CBC  Recent Labs Lab 12/29/15 2240 12/30/15 0326 12/31/15 0840  WBC 19.8* 20.3* 16.4*  HGB 10.8* 10.2* 10.1*  HCT 33.4* 32.1* 31.4*  PLT 232 191 179  MCV 90.5 89.9 90.6  MCH 29.2 28.5 29.2  MCHC 32.3 31.7* 32.3  RDW 18.7* 18.8* 18.8*  LYMPHSABS 0.8*  --   --   MONOABS 0.9  --   --   EOSABS 0.1  --   --   BASOSABS 0.0  --   --    ------------------------------------------------------------------------------------------------------------------  Chemistries   Recent Labs Lab 12/29/15 2240 12/30/15 0326 12/31/15 0840  NA 140 139 137  K 4.4 4.2 5.5*  CL 99* 101 97*  CO2 32 33* 32  GLUCOSE 392* 333* 162*  BUN 64* 66* 88*  CREATININE 1.75* 1.83* 3.06*  CALCIUM 8.9 8.5* 8.3*   ------------------------------------------------------------------------------------------------------------------ estimated creatinine clearance is 17.9 mL/min (by C-G formula based on Cr of 3.06). ------------------------------------------------------------------------------------------------------------------ No results for input(s): TSH, T4TOTAL, T3FREE, THYROIDAB in the last 72 hours.  Invalid input(s): FREET3  Cardiac Enzymes  Recent Labs Lab 12/29/15 2240 12/30/15 0326  TROPONINI 0.06* 0.09*   ------------------------------------------------------------------------------------------------------------------ Invalid input(s): POCBNP ---------------------------------------------------------------------------------------------------------------  RADIOLOGY: Dg Chest Port 1 View  12/29/2015  CLINICAL DATA:  Acute onset of respiratory distress. Initial encounter. EXAM: PORTABLE CHEST 1 VIEW COMPARISON:  None. FINDINGS: The lungs are well-aerated. Small bilateral  pleural effusions are seen. Vascular congestion is noted, with increased interstitial markings, concerning for pulmonary edema. There is  no evidence of pneumothorax. The cardiomediastinal silhouette is enlarged. A pacemaker is noted overlying the left chest wall, with a single lead partially imaged. No acute osseous abnormalities are seen. IMPRESSION: Vascular congestion and cardiomegaly, with increased interstitial markings, concerning for pulmonary edema. Small bilateral pleural effusions seen. Electronically Signed   By: Garald Balding M.D.   On: 12/29/2015 22:54    EKG:  Orders placed or performed during the hospital encounter of 12/29/15  . ED EKG  . ED EKG  . EKG 12-Lead  . EKG 12-Lead    ASSESSMENT AND PLAN:  Active Problems:   CHF (congestive heart failure) ([place]) 1. Acute on chronic systolic congestive heart failure with acute pulmonary edema, resolved, discontinue diuresis with Lasix due to renal failure. Repeat chest x-ray tomorrow morning 2. Bacterial pneumonia, healthcare associated, continue broad-spectrum antibiotics with Zithromax Vanco and Zosyn, get sputum cultures if possible. Blood cultures negative so far 3. Elevated troponin, likely demand ischemia. Echocardiogram in May 2016 revealed LVH, ejection fraction of 45-50%, aortic stenosis, elevated central venous in the right atrial pressures. Estimated aortic valve area was 1.3-1.5 square cm. . Audiology consultation is requested 4 . Acute on chronic Renal failure, worsened with diuresis, stop Lasix, follow kidney function tomorrow morning 5. Leukocytosis, improved with antibiotic therapy   Management plans discussed with the patient, power of attorney for medical care and they are in agreement.   DRUG ALLERGIES:  Allergies  Allergen Reactions  . Ace Inhibitors   . Dristan Cold [Chlorphen-Pe-Acetaminophen]   . Iodine   . Keppra [Levetiracetam]   . Latex   . Other     ARB-angiotension receptor antsgonist All topical  AG treatment  . Silver     silverdeen cream  . Statins     CODE STATUS:     Code Status Orders        Start     Ordered   12/29/15 2348  Full code   Continuous     12/29/15 2349    Advance Directive Documentation        Most Recent Value   Type of Advance Directive  Out of facility DNR (pink MOST or yellow form)   Pre-existing out of facility DNR order (yellow form or pink MOST form)     "MOST" Form in Place?        TOTAL CRITICAL CARE TIME TAKING CARE OF THIS PATIENT: 50 minutes.  Discussed with patient's power of attorney, treatment plan and discharge planning. All questions were answered. Time spent approximately 15 minutes. On discussion today  Marijah Larranaga M.D on 12/31/2015 at 3:19 PM  Between 7am to 6pm - Pager - [phone]  After 6pm go to [email or web address] - password [email or web address] [place] [facility]  Office  [phone]  CC: Primary care physician; Juluis Pitch, MD

## 2015-12-31 NOTE — Progress Notes (Signed)
300 ml dark concentrated urine out with in and out catheter

## 2015-12-31 NOTE — Care Management (Addendum)
Patient presents from [facility] for hypoxia.  02 sats were in the 70's upon ems arrival.  She required admission to icu due to the need for continuous bipap.  It appears she is under a long term plan of care at the facility.  Patient is not sure if she uses 02 chronically.   Patient does have alzheimer's.  Notified CSW.

## 2015-12-31 NOTE — Progress Notes (Signed)
ANTIBIOTIC CONSULT NOTE - INITIAL  Pharmacy Consult for vancomycin and Zosyn dosing Indication: pneumonia  Allergies  Allergen Reactions  . Ace Inhibitors   . Dristan Cold [Chlorphen-Pe-Acetaminophen]   . Iodine   . Keppra [Levetiracetam]   . Latex   . Other     ARB-angiotension receptor antsgonist All topical AG treatment  . Silver     silverdeen cream  . Statins     Patient Measurements: Height: 5\' 5"  (165.1 cm) Weight: 216 lb 14.9 oz (98.4 kg) IBW/kg (Calculated) : 57 Adjusted Body Weight: 64kg  Vital Signs: Temp: 99.2 F (37.3 C) (01/02 1216) Temp Source: Oral (01/02 1216) BP: 135/57 mmHg (01/02 1300) Pulse Rate: 59 (01/02 1300) Intake/Output from previous day: 01/01 0701 - 01/02 0700 In: 1420 [P.O.:1070; IV Piggyback:350] Out: 175 [Urine:175] Intake/Output from this shift: Total I/O In: 50 [IV Piggyback:50] Out: -   Labs:  Recent Labs  12/29/15 2240 12/30/15 0326 12/31/15 0840  WBC 19.8* 20.3* 16.4*  HGB 10.8* 10.2* 10.1*  PLT 232 191 179  CREATININE 1.75* 1.83* 3.06*   Estimated Creatinine Clearance: 17.9 mL/min (by C-G formula based on Cr of 3.06). No results for input(s): VANCOTROUGH, VANCOPEAK, VANCORANDOM, GENTTROUGH, GENTPEAK, GENTRANDOM, TOBRATROUGH, TOBRAPEAK, TOBRARND, AMIKACINPEAK, AMIKACINTROU, AMIKACIN in the last 72 hours.   Microbiology: Recent Results (from the past 720 hour(s))  Blood culture (routine x 2)     Status: None (Preliminary result)   Collection Time: 12/29/15 11:05 PM  Result Value Ref Range Status   Specimen Description BLOOD BLOOD RIGHT FOREARM  Final   Special Requests BOTTLES DRAWN AEROBIC AND ANAEROBIC 6CC  Final   Culture NO GROWTH < 12 HOURS  Final   Report Status PENDING  Incomplete  MRSA PCR Screening     Status: None   Collection Time: 12/30/15  1:52 AM  Result Value Ref Range Status   MRSA by PCR NEGATIVE NEGATIVE Final    Comment:        The GeneXpert MRSA Assay (FDA approved for NASAL  specimens only), is one component of a comprehensive MRSA colonization surveillance program. It is not intended to diagnose MRSA infection nor to guide or monitor treatment for MRSA infections.     Medical History: Past Medical History  Diagnosis Date  . Hypokalemia   . UTI (lower urinary tract infection)   . Vaginitis   . Cognitive communication deficit   . Pacemaker   . Sleep apnea   . Hypertension   . Chronic kidney disease   . Malignant neoplasm ([place]) skin of breast  . Diabetes mellitus without complication ([place])   . Spinal stenosis   . Chronic foot ulcer ([place])   . A-fib ([place])   . Aortic stenosis, moderate   . Dementia   . PAD (peripheral artery disease) ([place])   . Seizure ([place])     Medications:   Assessment: 80 y/o F with acute CHF and HCAP.  Goal of Therapy:  Vancomycin trough level 15-20 mcg/ml  Plan:  Due to increased SCr, will increase Zosyn to 3.375 g EI q 12 hours.   Will change vancomycin to 1000 mg iv q 48 hours. Will not order trough for now, but will continue to follow renal function and adjust dosing/check levels as clinically incidcated.   Ulice Dash D 12/31/2015,2:19 PM

## 2015-12-31 NOTE — H&P (Signed)
Pulmonary Consult   PATIENT NAME: Kylie Martinez    MR#:  [id number]  DATE OF BIRTH:  1936/03/02  DATE OF ADMISSION:  12/29/2015  PRIMARY CARE PHYSICIAN: Juluis Pitch, MD   REQUESTING/REFERRING PHYSICIAN:Sudini  CHIEF COMPLAINT:   Chief Complaint  Patient presents with  . Respiratory Distress   resolved HISTORY OF PRESENT ILLNESS:  Patient with chronic resp failure and is back to baseline, alert and awake, follows commands On chronic biPAP at [facility]   DRUG ALLERGIES:   Allergies  Allergen Reactions  . Ace Inhibitors   . Dristan Cold [Chlorphen-Pe-Acetaminophen]   . Iodine   . Keppra [Levetiracetam]   . Latex   . Other     ARB-angiotension receptor antsgonist All topical AG treatment  . Silver     silverdeen cream  . Statins     REVIEW OF SYSTEMS:   Review of Systems  Constitutional: Negative for fever, chills and weight loss.  Eyes: Negative for blurred vision.  Respiratory: Negative for cough, shortness of breath and wheezing.   Cardiovascular: Negative for chest pain.  Musculoskeletal: Negative.   All other systems reviewed and are negative.   MEDICATIONS AT HOME:   Prior to Admission medications   Medication Sig Start Date End Date Taking? Authorizing Provider  acidophilus (RISAQUAD) CAPS capsule Take 1 capsule by mouth daily.   Yes Historical Provider, MD  amLODipine (NORVASC) 10 MG tablet Take 10 mg by mouth daily.   Yes Historical Provider, MD  aspirin 81 MG chewable tablet Chew 81 mg by mouth daily.   Yes Historical Provider, MD  docusate sodium (COLACE) 100 MG capsule Take 100 mg by mouth 2 (two) times daily as needed for mild constipation.   Yes Historical Provider, MD  ferrous sulfate 325 (65 FE) MG tablet Take 325 mg by mouth daily.   Yes Historical Provider, MD  furosemide (LASIX) 20 MG tablet Take 20 mg by mouth 2 (two) times daily.   Yes Historical Provider, MD  gabapentin (NEURONTIN) 100 MG capsule Take 100 mg by mouth at  bedtime.   Yes Historical Provider, MD  insulin NPH Human (HUMULIN N,NOVOLIN N) 100 UNIT/ML injection Inject 20-28 Units into the skin 2 (two) times daily. 28 units in the morning and 20 units in the evening   Yes Historical Provider, MD  Multiple Vitamin (MULTIVITAMIN WITH MINERALS) TABS tablet Take 1 tablet by mouth daily.   Yes Historical Provider, MD  nitroGLYCERIN (NITROSTAT) 0.4 MG SL tablet Place 0.4 mg under the tongue every 5 (five) minutes as needed for chest pain.   Yes Historical Provider, MD  omega-3 fish oil (MAXEPA) 1000 MG CAPS capsule Take 1 capsule by mouth daily.   Yes Historical Provider, MD  Pollen Extracts (PROSTAT PO) Take 30 mLs by mouth 2 (two) times daily.   Yes Historical Provider, MD  polyethylene glycol (MIRALAX / GLYCOLAX) packet Take 17 g by mouth daily as needed for mild constipation.   Yes Historical Provider, MD  potassium chloride (KLOR-CON) 20 MEQ packet Take 20 mEq by mouth daily.   Yes Historical Provider, MD  vitamin C (ASCORBIC ACID) 500 MG tablet Take 500 mg by mouth daily.   Yes Historical Provider, MD      VITAL SIGNS:  Blood pressure 126/63, pulse 60, temperature 97.7 F (36.5 C), temperature source Axillary, resp. rate 16, height 5\' 5"  (1.651 m), weight 216 lb 14.9 oz (98.4 kg), SpO2 98 %.  PHYSICAL EXAMINATION:  Physical Exam  GENERAL:  80 y.o.-year-old patient  lying in the bed comfortably. EYES: Pupils equal, round, reactive to light and accommodation. No scleral icterus. Extraocular muscles intact.  HEENT: Head atraumatic, normocephalic. Oropharynx and nasopharynx clear. No oropharyngeal erythema, moist oral mucosa  NECK:  Supple, no jugular venous distention. No thyroid enlargement, no tenderness.  LUNGS: no wheezing, BS b/l CARDIOVASCULAR: S1, S2 normal. No murmurs, rubs, or gallops.  ABDOMEN: Soft, nontender, nondistended. Bowel sounds present. No organomegaly or mass.  EXTREMITIES: No pedal edema, cyanosis, or clubbing. + 2 pedal & radial  pulses b/l.   NEUROLOGIC: Cranial nerves II through XII are intact. No focal Motor or sensory deficits appreciated b/l   LABORATORY PANEL:   CBC  Recent Labs Lab 12/31/15 0840  WBC 16.4*  HGB 10.1*  HCT 31.4*  PLT 179   ------------------------------------------------------------------------------------------------------------------  Chemistries   Recent Labs Lab 12/30/15 0326  NA 139  K 4.2  CL 101  CO2 33*  GLUCOSE 333*  BUN 66*  CREATININE 1.83*  CALCIUM 8.5*   ------------------------------------------------------------------------------------------------------------------  Cardiac Enzymes  Recent Labs Lab 12/30/15 0326  TROPONINI 0.09*   ------------------------------------------------------------------------------------------------------------------  RADIOLOGY:  Dg Chest Port 1 View  12/29/2015  CLINICAL DATA:  Acute onset of respiratory distress. Initial encounter. EXAM: PORTABLE CHEST 1 VIEW COMPARISON:  None. FINDINGS: The lungs are well-aerated. Small bilateral pleural effusions are seen. Vascular congestion is noted, with increased interstitial markings, concerning for pulmonary edema. There is no evidence of pneumothorax. The cardiomediastinal silhouette is enlarged. A pacemaker is noted overlying the left chest wall, with a single lead partially imaged. No acute osseous abnormalities are seen. IMPRESSION: Vascular congestion and cardiomegaly, with increased interstitial markings, concerning for pulmonary edema. Small bilateral pleural effusions seen. Electronically Signed   By: Garald Balding M.D.   On: 12/29/2015 22:54     IMPRESSION AND PLAN:  80 yo white female with acute resp failure from acute CHF exacerbation from probable acute pneumonia-resolved  1. Acute on chronic respiratory failure due to CHF and pneumonia-resolved Patient with chronic Resp insufficiency-on chronic BiPAP at NH -bipap as needed during the day and at night -continue iv  abx as prescribed -patient can be transferred to gen med floor-patient at baseline resp status  2.Acute on chronic systolic CHF with ejection fraction of 45% Has received 1 dose of IV Lasix in the emergency room.  -continue Lasix IV 40 mg 2 times a day.   3.Bilateral healthcare acquired pneumonia -continue  IV Zosyn, vancomycin and azithromycin.    4. Insulin-dependent diabetes mellitus Continue her NPH insulin. Start sliding scale insulin.  -wean IV steroids   5.Dementia Watch for inpatient delirium. Daughter Gevena Corrales is her healthcare power of attorney. Phone number is [phone]  6.DVT prophylaxis with renally dosed Lovenox   The Patient requires high complexity decision making for assessment and support, frequent evaluation and titration of therapies.   Patient seems to be at baseline Resp status and has chronic BIPAP therapy at [facility], if the NH is able to provide NON-invasive vent support as an outpatient, then the Gen Med Floors should be more than able to handle the care of this patient.  Corrin Parker, M.D.  [facility]  Medical Director [place] Director [facility] Cardio-Pulmonary Department

## 2015-12-31 NOTE — Consult Note (Signed)
[place]  CARDIOLOGY CONSULT NOTE  Patient ID: Kylie Martinez MRN: [id number] DOB/AGE: 05/12/1936 80 y.o.  Admit date: 12/29/2015 Referring Physician Dr. Judeen Hammans Primary Physician   Primary Cardiologist Dr. Nehemiah Massed Reason for Consultation chf  HPI: Patient is a 80 year old female with history of hypertension, diabetes, COPD, chronic systolic heart failure with ejection fraction of 45% with BiPAP requirements frequently who was admitted after being noted to have saturations in the 70% range at her nursing home.  Patient has a history dimension is not able to give any history.  She has no complaints.  Admission chest x-ray showed pulmonary edema and bilateral pneumonia with elevated white blood cell count and a temperature of a 100.4.  Patient remains hemodynamically stable and is resting comfortably.  She is again a poor historian.  She has no complaints of chest pain or shortness of breath.  Chest x-ray is pending for the morning.  Most recent chest x-ray  Several days ago revealed vascular congestion and cardiomegaly with increased interstitial markings concerning for pulmonary edema.  She had small bilateral pleural effusions. She is being treated with amlodipine,, steroids and emperic abx. Appears clinically stable aat present  ROS Review of Systems - History obtained from chart review and unobtainable from patient due to mental status General ROS: positive for  - hypoxia Respiratory ROS: no cough, shortness of breath, or wheezing Cardiovascular ROS: no chest pain or dyspnea on exertion Gastrointestinal ROS: no abdominal pain, change in bowel habits, or black or bloody stools Musculoskeletal ROS: negative Neurological ROS: no TIA or stroke symptoms   Past Medical History  Diagnosis Date  . Hypokalemia   . UTI (lower urinary tract infection)   . Vaginitis   . Cognitive communication deficit   . Pacemaker   . Sleep apnea   . Hypertension    . Chronic kidney disease   . Malignant neoplasm ([place]) skin of breast  . Diabetes mellitus without complication ([place])   . Spinal stenosis   . Chronic foot ulcer ([place])   . A-fib ([place])   . Aortic stenosis, moderate   . Dementia   . PAD (peripheral artery disease) ([place])   . Seizure [facility])     Family History  Problem Relation Age of Onset  . Diabetes Mother   . Heart failure Sister     Social History   Social History  . Marital Status: Unknown    Spouse Name: N/A  . Number of Children: N/A  . Years of Education: N/A   Occupational History  . Not on file.   Social History Main Topics  . Smoking status: Never Smoker   . Smokeless tobacco: Not on file  . Alcohol Use: Not on file  . Drug Use: Not on file  . Sexual Activity: Not on file   Other Topics Concern  . Not on file   Social History Narrative  . No narrative on file    No past surgical history on file.   Prescriptions prior to admission  Medication Sig Dispense Refill Last Dose  . acidophilus (RISAQUAD) CAPS capsule Take 1 capsule by mouth daily.   unknown at unknown  . amLODipine (NORVASC) 10 MG tablet Take 10 mg by mouth daily.   unknown at unknown  . aspirin 81 MG chewable tablet Chew 81 mg by mouth daily.   unknown at unknown  . docusate sodium (COLACE) 100 MG capsule Take 100 mg by mouth 2 (two) times daily as needed for mild  constipation.   unknown at unknown  . ferrous sulfate 325 (65 FE) MG tablet Take 325 mg by mouth daily.   unknown at unknown  . furosemide (LASIX) 20 MG tablet Take 20 mg by mouth 2 (two) times daily.   unknown at unknown  . gabapentin (NEURONTIN) 100 MG capsule Take 100 mg by mouth at bedtime.   unknown at unknown  . insulin NPH Human (HUMULIN N,NOVOLIN N) 100 UNIT/ML injection Inject 20-28 Units into the skin 2 (two) times daily. 28 units in the morning and 20 units in the evening   unknown at 1700  . Multiple Vitamin (MULTIVITAMIN WITH MINERALS) TABS tablet Take 1 tablet by mouth daily.    unknown at unknown  . nitroGLYCERIN (NITROSTAT) 0.4 MG SL tablet Place 0.4 mg under the tongue every 5 (five) minutes as needed for chest pain.   unknown at unknown  . omega-3 fish oil (MAXEPA) 1000 MG CAPS capsule Take 1 capsule by mouth daily.   unknown at unknown  . Pollen Extracts (PROSTAT PO) Take 30 mLs by mouth 2 (two) times daily.   unknown at unknown  . polyethylene glycol (MIRALAX / GLYCOLAX) packet Take 17 g by mouth daily as needed for mild constipation.   unknown at unknown  . potassium chloride (KLOR-CON) 20 MEQ packet Take 20 mEq by mouth daily.   unknown at unknown  . vitamin C (ASCORBIC ACID) 500 MG tablet Take 500 mg by mouth daily.   unknown at unknown    Physical Exam: Blood pressure 122/55, pulse 59, temperature 98 F (36.7 C), temperature source Axillary, resp. rate 22, height 5\' 5"  (1.651 m), weight 98.4 kg (216 lb 14.9 oz), SpO2 98 %.  General appearance: cooperative Head: Normocephalic, without obvious abnormality, atraumatic Resp: diminished breath sounds bibasilar and rhonchi bibasilar Cardio: regular rate and rhythm GI: soft, non-tender; bowel sounds normal; no masses,  no organomegaly Extremities: extremities normal, atraumatic, no cyanosis or edema Neurologic: Mental status: orientation: person Sensory: normal Motor: grossly normal Labs:   Lab Results  Component Value Date   WBC 16.4* 12/31/2015   HGB 10.1* 12/31/2015   HCT 31.4* 12/31/2015   MCV 90.6 12/31/2015   PLT 179 12/31/2015    Recent Labs Lab 12/31/15 0840  NA 137  K 5.5*  CL 97*  CO2 32  BUN 88*  CREATININE 3.06*  CALCIUM 8.3*  GLUCOSE 162*   Lab Results  Component Value Date   TROPONINI 0.09* 12/30/2015      Radiology: Improving CHF. Persistent left lower lobe atelectasis. There is no large pleural effusion.  EKG: junctional rhythm  ASSESSMENT AND PLAN:  80 yo female withi history of dementia and bipap requiring pulmonary disease admitted with hypoxia. Difficult historian  due to dementia. CXR is improved form previous one. Breathing with nasal canula oxygen at 2 liters currently with sats above 90. Would conitnue with current therapy and should be ok for transfer to telemetry. Continue with amlodipine and emperic abx.  Signed: Teodoro Spray MD, [facility] 12/31/2015, 8:32 PM

## 2015-12-31 NOTE — Progress Notes (Signed)
Initial Nutrition Assessment   INTERVENTION:   Meals and Snacks: Cater to patient preferences on Carb Modified diet order Medical Food Supplement Therapy: will send No Sugar Added Mighty shakes BID (each supplement providing 11g protein and 300kcals)   NUTRITION DIAGNOSIS:   Increased nutrient needs related to wound healing as evidenced by estimated needs.  GOAL:   Patient will meet greater than or equal to 90% of their needs  MONITOR:    (Energy Intake, Electrolyte and renal Profile, Skin, Glucose Profile, Anthropometrics)  REASON FOR ASSESSMENT:   Diagnosis    ASSESSMENT:   Pt admitted with respiratory distress secondary to CHF and pna. Pt on bipap, now transitioned to [place]; Pt to be transferred out of ICU. Pt with h/o dementia from [facility].  Past Medical History  Diagnosis Date  . Hypokalemia   . UTI (lower urinary tract infection)   . Vaginitis   . Cognitive communication deficit   . Pacemaker   . Sleep apnea   . Hypertension   . Chronic kidney disease   . Malignant neoplasm ([place]) skin of breast  . Diabetes mellitus without complication ([place])   . Spinal stenosis   . Chronic foot ulcer ([place])   . A-fib ([place])   . Aortic stenosis, moderate   . Dementia   . PAD (peripheral artery disease) ([place])   . Seizure ([place])      Diet Order:  Diet Carb Modified Fluid consistency:: Thin; Room service appropriate?: Yes    Current Nutrition: Pt eating this am on rounds, had eaten most of her breakfast.   Food/Nutrition-Related History: Recorded po intake 70% of meal yesterday. Per MST no decrease in appetite PTA.   Scheduled Medications:  . acidophilus  1 capsule Oral Daily  . amLODipine  10 mg Oral Daily  . antiseptic oral rinse  7 mL Mouth Rinse q12n4p  . aspirin EC  81 mg Oral Daily  . azithromycin  500 mg Oral Daily  . chlorhexidine  15 mL Mouth Rinse BID  . docusate sodium  100 mg Oral BID  . enoxaparin (LOVENOX) injection  30 mg Subcutaneous Q24H  .  ferrous sulfate  325 mg Oral Daily  . furosemide  40 mg Intravenous BID  . gabapentin  100 mg Oral QHS  . insulin aspart  0-20 Units Subcutaneous TID WC  . insulin aspart  0-5 Units Subcutaneous QHS  . insulin detemir  20 Units Subcutaneous QHS  . insulin detemir  28 Units Subcutaneous q morning - 10a  . ipratropium-albuterol  3 mL Nebulization Q4H  . methylPREDNISolone (SOLU-MEDROL) injection  60 mg Intravenous Q12H  . piperacillin-tazobactam (ZOSYN)  IV  3.375 g Intravenous 3 times per day  . potassium chloride  20 mEq Oral Daily  . sodium chloride  3 mL Intravenous Q12H  . sodium chloride  3 mL Intravenous Q12H  . vancomycin  1,000 mg Intravenous Q36H    Electrolyte/Renal Profile and Glucose Profile:   Recent Labs Lab 12/29/15 2240 12/30/15 0326 12/31/15 0840  NA 140 139 137  K 4.4 4.2 5.5*  CL 99* 101 97*  CO2 32 33* 32  BUN 64* 66* 88*  CREATININE 1.75* 1.83* 3.06*  CALCIUM 8.9 8.5* 8.3*  GLUCOSE 392* 333* 162*   Protein Profile: No results for input(s): ALBUMIN in the last 168 hours.  Gastrointestinal Profile: soft abdomen, active BS Last BM: unknown   Nutrition-Focused Physical Exam Findings:  Unable to complete Nutrition-Focused physical exam at this time.    Weight  Change: Per MST no decrease in weight. RD notes weight on admission 165lbs, weight this am recorded 216lbs.   Skin:   multiple diabetic foot ulcers   Height:   Ht Readings from Last 1 Encounters:  12/29/15 5\' 5"  (1.651 m)    Weight:   Wt Readings from Last 1 Encounters:  12/31/15 216 lb 14.9 oz (98.4 kg)    Ideal Body Weight:   56.8kg  BMI:  Body mass index is 36.1 kg/(m^2).  Estimated Nutritional Needs:   Kcal:  using IBW of 56.8kg, BEE: 1053kcals, TEE: (IF 1.1-1.3)(AF 1.2) 1391-1644kcals  Protein:  68-85g protein (1.2-1.5g/kg)   Fluid:  1420-1778mL of fluid (25-60mL/kg)  EDUCATION NEEDS:   Education needs no appropriate at this time   [place], RD, LDN Pager [phone] Weekend/On-Call Pager [phone]

## 2015-12-31 NOTE — Progress Notes (Signed)
Patient has urinated all day. Bladder scan done and shows about 314 ml of urine. I notified Dr. Posey Pronto and order received for an in and out catheter.

## 2016-01-01 ENCOUNTER — Inpatient Hospital Stay (HOSPITAL_COMMUNITY)
Admit: 2016-01-01 | Discharge: 2016-01-01 | Disposition: A | Discharge status: Home / Self Care | Payer: Medicare Other | Attending: Internal Medicine | Admitting: Internal Medicine

## 2016-01-01 ENCOUNTER — Inpatient Hospital Stay: Payer: Medicare Other

## 2016-01-01 DIAGNOSIS — J9621 Acute and chronic respiratory failure with hypoxia: Secondary | ICD-10-CM

## 2016-01-01 DIAGNOSIS — I509 Heart failure, unspecified: Secondary | ICD-10-CM

## 2016-01-01 DIAGNOSIS — R739 Hyperglycemia, unspecified: Secondary | ICD-10-CM

## 2016-01-01 DIAGNOSIS — J810 Acute pulmonary edema: Secondary | ICD-10-CM

## 2016-01-01 LAB — BASIC METABOLIC PANEL
ANION GAP: 13 (ref 5–15)
Anion gap: 13 (ref 5–15)
BUN: 109 mg/dL — AB (ref 6–20)
BUN: 123 mg/dL — ABNORMAL HIGH (ref 6–20)
CHLORIDE: 89 mmol/L — AB (ref 101–111)
CO2: 29 mmol/L (ref 22–32)
CO2: 26 mmol/L (ref 22–32)
CREATININE: 4.1 mg/dL — AB (ref 0.44–1.00)
CREATININE: 4.71 mg/dL — AB (ref 0.44–1.00)
Calcium: 8.1 mg/dL — ABNORMAL LOW (ref 8.9–10.3)
Calcium: 7.9 mg/dL — ABNORMAL LOW (ref 8.9–10.3)
Chloride: 88 mmol/L — ABNORMAL LOW (ref 101–111)
GFR calc non Af Amer: 10 mL/min — ABNORMAL LOW (ref >60)
GFR calc non Af Amer: 8 mL/min — ABNORMAL LOW (ref >60)
GFR, EST AFRICAN AMERICAN: 11 mL/min — AB (ref >60)
GFR, EST AFRICAN AMERICAN: 9 mL/min — AB (ref >60)
GLUCOSE: 286 mg/dL — AB (ref 65–99)
Glucose, Bld: 320 mg/dL — ABNORMAL HIGH (ref 65–99)
Potassium: 6.3 mmol/L — ABNORMAL HIGH (ref 3.5–5.1)
Potassium: 5.7 mmol/L — ABNORMAL HIGH (ref 3.5–5.1)
SODIUM: 130 mmol/L — AB (ref 135–145)
Sodium: 128 mmol/L — ABNORMAL LOW (ref 135–145)

## 2016-01-01 LAB — URINALYSIS COMPLETE WITH MICROSCOPIC (ARMC ONLY)
BACTERIA UA: NONE SEEN
BILIRUBIN URINE: NEGATIVE
GLUCOSE, UA: 50 mg/dL — AB
KETONES UR: NEGATIVE mg/dL
NITRITE: NEGATIVE
Protein, ur: NEGATIVE mg/dL
SPECIFIC GRAVITY, URINE: 1.019 (ref 1.005–1.030)
Squamous Epithelial / LPF: NONE SEEN
pH: 5 (ref 5.0–8.0)

## 2016-01-01 LAB — CBC
HEMATOCRIT: 30.9 % — AB (ref 35.0–47.0)
Hemoglobin: 9.9 g/dL — ABNORMAL LOW (ref 12.0–16.0)
MCH: 29.4 pg (ref 26.0–34.0)
MCHC: 32 g/dL (ref 32.0–36.0)
MCV: 91.7 fL (ref 80.0–100.0)
Platelets: 189 10*3/uL (ref 150–440)
RBC: 3.37 MIL/uL — ABNORMAL LOW (ref 3.80–5.20)
RDW: 18.1 % — AB (ref 11.5–14.5)
WBC: 14.1 10*3/uL — AB (ref 3.6–11.0)

## 2016-01-01 LAB — GLUCOSE, CAPILLARY
Glucose-Capillary: 297 mg/dL — ABNORMAL HIGH (ref 65–99)
Glucose-Capillary: 345 mg/dL — ABNORMAL HIGH (ref 65–99)
Glucose-Capillary: 260 mg/dL — ABNORMAL HIGH (ref 65–99)
Glucose-Capillary: 165 mg/dL — ABNORMAL HIGH (ref 65–99)

## 2016-01-01 MED ORDER — SODIUM POLYSTYRENE SULFONATE 15 GM/60ML PO SUSP
60.0000 g | Freq: Once | ORAL | Status: AC
  Administered 2016-01-01: 60 g via ORAL
  Filled 2016-01-01: qty 240

## 2016-01-01 MED ORDER — HEPARIN SODIUM (PORCINE) 5000 UNIT/ML IJ SOLN
5000.0000 [IU] | Freq: Three times a day (TID) | INTRAMUSCULAR | Status: DC
  Administered 2016-01-01 – 2016-01-08 (×20): 5000 [IU] via SUBCUTANEOUS
  Filled 2016-01-01 (×20): qty 1

## 2016-01-01 MED ORDER — IPRATROPIUM-ALBUTEROL 0.5-2.5 (3) MG/3ML IN SOLN
3.0000 mL | Freq: Four times a day (QID) | RESPIRATORY_TRACT | Status: DC | PRN
  Administered 2016-01-07 (×2): 3 mL via RESPIRATORY_TRACT
  Filled 2016-01-01 (×2): qty 3

## 2016-01-01 MED ORDER — SODIUM CHLORIDE 0.9 % IV SOLN
INTRAVENOUS | Status: DC
  Administered 2016-01-01 – 2016-01-02 (×2): via INTRAVENOUS

## 2016-01-01 NOTE — Progress Notes (Addendum)
Patients daughter, who has POA and is the legal guardian for patient, reports that patient wears BIPAP 24/7. She only removes it when she is eating or doing an activity. She also states that she does not wear oxygen when she is off of BIPAP. Orders are for continuous BIPAP and patient needs to wear this at all times. Patient is alert, and resting with daughter at bedside. No other changes noted. Will continue to assess and monitor.   Patients daughter states that she is currently working and that physician can call Jeanmarie Plant [phone] to make any necessary decisions for the patient.

## 2016-01-01 NOTE — Progress Notes (Signed)
No distress No new complaints  Filed Vitals:   01/01/16 0600 01/01/16 0700 01/01/16 0800 01/01/16 0900  BP:  131/55 139/61 137/58  Pulse: 59 61 60 60  Temp:   98.7 F (37.1 C)   TempSrc:   Oral   Resp: 23 18 23 18   Height:      Weight:      SpO2: 98% 96% 96% 94%   NAD HEENT WNL Few bibasilar crackles, no wheezes Reg, 3/6 systolic M @ LLSB > neck NABS, soft No edema, chronic stasis changes  BMP Latest Ref Rng 01/01/2016 12/31/2015 12/30/2015  Glucose 65 - 99 mg/dL 320(H) 162(H) 333(H)  BUN 6 - 20 mg/dL 109(H) 88(H) 66(H)  Creatinine 0.44 - 1.00 mg/dL 4.10(H) 3.06(H) 1.83(H)  Sodium 135 - 145 mmol/L 130(L) 137 139  Potassium 3.5 - 5.1 mmol/L 6.3(H) 5.5(H) 4.2  Chloride 101 - 111 mmol/L 88(L) 97(L) 101  CO2 22 - 32 mmol/L 29 32 33(H)  Calcium 8.9 - 10.3 mg/dL 8.1(L) 8.3(L) 8.5(L)    CBC Latest Ref Rng 01/01/2016 12/31/2015 12/30/2015  WBC 3.6 - 11.0 K/uL 14.1(H) 16.4(H) 20.3(H)  Hemoglobin 12.0 - 16.0 g/dL 9.9(L) 10.1(L) 10.2(L)  Hematocrit 35.0 - 47.0 % 30.9(L) 31.4(L) 32.1(L)  Platelets 150 - 440 K/uL 189 179 191    CXR: improved edema pattern  IMPRESSION: Acute on chronic respiratory failure - suspect pulm edema > PNA AKI on CKD with rising Cr Hyperkalemia Aortic stenosis IDDM with steroid induced hyperglycemia  PLAN/REC: DC abx DC methylprednisolone Agree with transfer out of ICU Discussed with Dr Juleen China - might require HD but not planned currently  PCCM will sign off. Please call if we can be of further assistance  Merton Border, MD PCCM service Mobile [phone] Pager [phone]

## 2016-01-01 NOTE — Progress Notes (Signed)
Per MD note patient's HPOA is her daughter Yvonna Burks [phone]). CSW contacted patient's daughter/ HPOA to discuss discharge plans. Patient's daughter requested a call back around 11:00 AM.   CSW was informed by [facility] that patient is from [facility] . CSW contacted Broadus John, admissions coordinator at [facility] and inquired if patient is a resident of [facility]. Per Broadus John patient is a resident of the LTC unit at [facility]. He reports that patient can return at discharge.   CSW faxed FL2 to [facility]. CSW will continue to follow and assist.   Ernest Pine, MSW, [place] Work Department [phone]

## 2016-01-01 NOTE — Progress Notes (Signed)
Pt still remains anuric.  Bladder scan was done twice during the night.  The first time it did not show any urine in the bladder.  The second time it showed 57cc in the bladder.  Pt attempted to use the bsc but was not successful.  Pt stayed dry all night long.  [place] physicians aware.

## 2016-01-01 NOTE — Progress Notes (Signed)
80 y/o F with acute CHF on Lovenox 30 mg daily for DVT prophylaxis.   CrCl= 13.5 ml/min  Due to CrCl < 15 ml/min, will convert patient to [place] heparin.   Ulice Dash, PharmD

## 2016-01-01 NOTE — Progress Notes (Signed)
*  PRELIMINARY RESULTS* Echocardiogram 2D Echocardiogram has been performed.  Kylie Martinez 01/01/2016, 1:11 PM

## 2016-01-01 NOTE — Progress Notes (Signed)
[place] Progress Note Patient Name: Kylie Martinez DOB: 1936/11/14 MRN: [id number]   Date of Service  01/01/2016  HPI/Events of Note  Notified that pt remains anuric. Given her total body volume overload, BiPAp requirement i will defer fluid challenge. Suspect will need renal eval and discussion of goals regarding other interventions, ? An HD candidate  eICU Interventions       Intervention Category Intermediate Interventions: Other:  Cordelle Dahmen S. 01/01/2016, 1:23 AM

## 2016-01-01 NOTE — NC FL2 (Signed)
[place] LEVEL OF CARE SCREENING TOOL     IDENTIFICATION  Patient Name: Kylie Martinez Birthdate: 1936-04-19 Sex: female Admission Date (Current Location): 12/29/2015  [facility] and [place] Number:  [name]  (SN:[id number]) Facility and Address:  [facility], [address], [place], [place] [address]      Provider Number: [id number]  Attending Physician Name and Address:  Gladstone Lighter, MD  Relative Name and Phone Number:       Current Level of Care: Hospital Recommended Level of Care: [place] ([facility] ) Prior Approval Number:    Date Approved/Denied:   PASRR Number:  ([id number])  Discharge Plan: Domiciliary (Rest home)    Current Diagnoses:  Dementia  Patient Active Problem List   Diagnosis Date Noted  . CHF (congestive heart failure) ([place]) 12/29/2015    Orientation RESPIRATION BLADDER Height & Weight    Self  Other (Comment) (Bi-PAP) Incontinent 5\' 5"  (165.1 cm) 221 lbs.  BEHAVIORAL SYMPTOMS/MOOD NEUROLOGICAL BOWEL NUTRITION STATUS   (None) Convulsions/Seizures (Seizure (HCC)) Continent Diet (Carb Modified )  AMBULATORY STATUS COMMUNICATION OF NEEDS Skin   Extensive Assist Verbally Other (Comment) (Diabetic Ulcer Right toe & Diabetic Ulcer Left 3rd toe )                       Personal Care Assistance Level of Assistance  Bathing, Feeding, Dressing Bathing Assistance: Limited assistance Feeding assistance: Independent Dressing Assistance: Limited assistance     Functional Limitations Info  Sight, Hearing, Speech Sight Info: Adequate Hearing Info: Adequate Speech Info: Adequate    SPECIAL CARE FACTORS FREQUENCY                       Contractures      Additional Factors Info  Code Status, Allergies, Insulin Sliding Scale Code Status Info:  (Full Code ) Allergies Info:  (Ace Inhibitors, Dristan Cold Chlorphen-pe-acetaminophen, Iodine, Keppra Levetiracetam, Latex,  ARB-angiotension receptor antsgonist, All topical AG treatment, Silver- silverdeen cream, and Statins)   Insulin Sliding Scale Info:  (insulin aspart (novoLOG) injection 0-20 Units; 3 times daily with meals )       Current Medications (01/01/2016):  This is the current hospital active medication list Current Facility-Administered Medications  Medication Dose Route Frequency Provider Last Rate Last Dose  . 0.9 %  sodium chloride infusion  250 mL Intravenous PRN Srikar Sudini, MD      . 0.9 %  sodium chloride infusion   Intravenous Continuous Gladstone Lighter, MD 50 mL/hr at 01/01/16 0909    . acetaminophen (TYLENOL) tablet 650 mg  650 mg Oral Q6H PRN Hillary Bow, MD       Or  . acetaminophen (TYLENOL) suppository 650 mg  650 mg Rectal Q6H PRN Srikar Sudini, MD      . acidophilus (RISAQUAD) capsule 1 capsule  1 capsule Oral Daily Hillary Bow, MD   1 capsule at 01/01/16 0856  . amLODipine (NORVASC) tablet 10 mg  10 mg Oral Daily Hillary Bow, MD   10 mg at 01/01/16 0856  . antiseptic oral rinse (CPC / CETYLPYRIDINIUM CHLORIDE 0.05%) solution 7 mL  7 mL Mouth Rinse q12n4p Srikar Sudini, MD   7 mL at 12/31/15 1600  . aspirin EC tablet 81 mg  81 mg Oral Daily Hillary Bow, MD   81 mg at 01/01/16 0856  . azithromycin (ZITHROMAX) tablet 500 mg  500 mg Oral Daily Hillary Bow, MD   500 mg at 01/01/16  [id number]  . bisacodyl (DULCOLAX) suppository 10 mg  10 mg Rectal Daily PRN Hillary Bow, MD      . chlorhexidine (PERIDEX) 0.12 % solution 15 mL  15 mL Mouth Rinse BID Hillary Bow, MD   15 mL at 01/01/16 0856  . docusate sodium (COLACE) capsule 100 mg  100 mg Oral BID Hillary Bow, MD   100 mg at 01/01/16 0856  . enoxaparin (LOVENOX) injection 30 mg  30 mg Subcutaneous Q24H Hillary Bow, MD   30 mg at 12/31/15 2127  . ferrous sulfate tablet 325 mg  325 mg Oral Daily Hillary Bow, MD   325 mg at 01/01/16 0856  . gabapentin (NEURONTIN) capsule 100 mg  100 mg Oral QHS Hillary Bow, MD   100 mg at  12/31/15 2124  . insulin aspart (novoLOG) injection 0-20 Units  0-20 Units Subcutaneous TID WC Hillary Bow, MD   11 Units at 01/01/16 0857  . insulin aspart (novoLOG) injection 0-5 Units  0-5 Units Subcutaneous QHS Hillary Bow, MD   5 Units at 12/31/15 2128  . insulin detemir (LEVEMIR) injection 20 Units  20 Units Subcutaneous QHS Hillary Bow, MD   20 Units at 12/31/15 2130  . insulin detemir (LEVEMIR) injection 28 Units  28 Units Subcutaneous q morning - 10a Hillary Bow, MD   28 Units at 01/01/16 0858  . ipratropium-albuterol (DUONEB) 0.5-2.5 (3) MG/3ML nebulizer solution 3 mL  3 mL Nebulization Q4H Srikar Sudini, MD   3 mL at 01/01/16 0733  . methylPREDNISolone sodium succinate (SOLU-MEDROL) 125 mg/2 mL injection 60 mg  60 mg Intravenous Q12H Hillary Bow, MD   60 mg at 01/01/16 0554  . nitroGLYCERIN (NITROSTAT) SL tablet 0.4 mg  0.4 mg Sublingual Q5 min PRN Srikar Sudini, MD      . ondansetron (ZOFRAN) tablet 4 mg  4 mg Oral Q6H PRN Srikar Sudini, MD       Or  . ondansetron (ZOFRAN) injection 4 mg  4 mg Intravenous Q6H PRN Hillary Bow, MD   4 mg at 12/30/15 1323  . piperacillin-tazobactam (ZOSYN) IVPB 3.375 g  3.375 g Intravenous Q12H Theodoro Grist, MD   3.375 g at 01/01/16 0858  . polyethylene glycol (MIRALAX / GLYCOLAX) packet 17 g  17 g Oral Daily PRN Srikar Sudini, MD      . sodium chloride 0.9 % injection 3 mL  3 mL Intravenous Q12H Hillary Bow, MD   3 mL at 01/01/16 0859  . sodium chloride 0.9 % injection 3 mL  3 mL Intravenous Q12H Hillary Bow, MD   3 mL at 01/01/16 0858  . sodium chloride 0.9 % injection 3 mL  3 mL Intravenous PRN Hillary Bow, MD   3 mL at 12/30/15 0230  . vancomycin (VANCOCIN) IVPB 1000 mg/200 mL premix  1,000 mg Intravenous Q48H Theodoro Grist, MD         Discharge Medications: Please see discharge summary for a list of discharge medications.  Relevant Imaging Results:  Relevant Lab Results:   Additional Information  (SSN:  [id number])  Lorenso Quarry Sharry Beining, LCSW

## 2016-01-01 NOTE — Consult Note (Signed)
[facility]  CONSULT NOTE    Date: 01/01/2016                  Martinez Name:  Kylie Martinez  MRN: [id number]  DOB: 11-09-1936  Age / Sex: 80 y.o., female         PCP: Juluis Pitch, MD                 Service Requesting Consult: Dr. Rosita Fire                 Reason for Consult: Acute renal failure with hyperkalemia on chronic kidney disease stage III            History of Present Illness: Kylie Martinez is a 80 y.o. white female SNF resident with insulin dependent diabetes, hypertension, atrial fibrillation, aortic stenosis, systolic congestive heart failure, dementia, peripheral arterial disease, seizure disorder, obstructive sleep apnea , who was admitted to [facility] on 12/29/2015 for Acute respiratory failure with hypoxia ([place]) [J96.01]   Martinez presented with fever and diagnosed with pneumonia and acute exacerbation of congestive heart failure. She was admitted with a creatinine of 1.75, baseline seems to around 2. Creatinine has trended up to 4.1, with BUN of 109, sodium of 130 and potassium of 6.3. She has UOP of 369mL.   Initially on Bipap, now weaned to [facility]. Started on azithromycin, pip/tazo, vanco and solumedrol. Wbc is trending down.   Martinez is a poor historian with baseline dementia.    Medications: Outpatient medications: Prescriptions prior to admission  Medication Sig Dispense Refill Last Dose  . acidophilus (RISAQUAD) CAPS capsule Take 1 capsule by mouth daily.   unknown at unknown  . amLODipine (NORVASC) 10 MG tablet Take 10 mg by mouth daily.   unknown at unknown  . aspirin 81 MG chewable tablet Chew 81 mg by mouth daily.   unknown at unknown  . docusate sodium (COLACE) 100 MG capsule Take 100 mg by mouth 2 (two) times daily as needed for mild constipation.   unknown at unknown  . ferrous sulfate 325 (65 FE) MG tablet Take 325 mg by mouth daily.   unknown at unknown  . furosemide (LASIX) 20 MG tablet Take 20 mg by mouth 2 (two) times  daily.   unknown at unknown  . gabapentin (NEURONTIN) 100 MG capsule Take 100 mg by mouth at bedtime.   unknown at unknown  . insulin NPH Human (HUMULIN N,NOVOLIN N) 100 UNIT/ML injection Inject 20-28 Units into Kylie skin 2 (two) times daily. 28 units in Kylie morning and 20 units in Kylie evening   unknown at 1700  . Multiple Vitamin (MULTIVITAMIN WITH MINERALS) TABS tablet Take 1 tablet by mouth daily.   unknown at unknown  . nitroGLYCERIN (NITROSTAT) 0.4 MG SL tablet Place 0.4 mg under Kylie tongue every 5 (five) minutes as needed for chest pain.   unknown at unknown  . omega-3 fish oil (MAXEPA) 1000 MG CAPS capsule Take 1 capsule by mouth daily.   unknown at unknown  . Pollen Extracts (PROSTAT PO) Take 30 mLs by mouth 2 (two) times daily.   unknown at unknown  . polyethylene glycol (MIRALAX / GLYCOLAX) packet Take 17 g by mouth daily as needed for mild constipation.   unknown at unknown  . potassium chloride (KLOR-CON) 20 MEQ packet Take 20 mEq by mouth daily.   unknown at unknown  . vitamin C (ASCORBIC ACID) 500 MG tablet Take 500 mg by mouth daily.  unknown at unknown    Current medications: Current Facility-Administered Medications  Medication Dose Route Frequency Provider Last Rate Last Dose  . 0.9 %  sodium chloride infusion  250 mL Intravenous PRN Srikar Sudini, MD      . 0.9 %  sodium chloride infusion   Intravenous Continuous Gladstone Lighter, MD 50 mL/hr at 01/01/16 0909    . acetaminophen (TYLENOL) tablet 650 mg  650 mg Oral Q6H PRN Hillary Bow, MD       Or  . acetaminophen (TYLENOL) suppository 650 mg  650 mg Rectal Q6H PRN Srikar Sudini, MD      . acidophilus (RISAQUAD) capsule 1 capsule  1 capsule Oral Daily Hillary Bow, MD   1 capsule at 01/01/16 0856  . amLODipine (NORVASC) tablet 10 mg  10 mg Oral Daily Hillary Bow, MD   10 mg at 01/01/16 0856  . antiseptic oral rinse (CPC / CETYLPYRIDINIUM CHLORIDE 0.05%) solution 7 mL  7 mL Mouth Rinse q12n4p Srikar Sudini, MD   7 mL at  12/31/15 1600  . aspirin EC tablet 81 mg  81 mg Oral Daily Hillary Bow, MD   81 mg at 01/01/16 0856  . azithromycin (ZITHROMAX) tablet 500 mg  500 mg Oral Daily Hillary Bow, MD   500 mg at 01/01/16 0857  . bisacodyl (DULCOLAX) suppository 10 mg  10 mg Rectal Daily PRN Hillary Bow, MD      . chlorhexidine (PERIDEX) 0.12 % solution 15 mL  15 mL Mouth Rinse BID Hillary Bow, MD   15 mL at 01/01/16 0856  . docusate sodium (COLACE) capsule 100 mg  100 mg Oral BID Hillary Bow, MD   100 mg at 01/01/16 0856  . enoxaparin (LOVENOX) injection 30 mg  30 mg Subcutaneous Q24H Hillary Bow, MD   30 mg at 12/31/15 2127  . ferrous sulfate tablet 325 mg  325 mg Oral Daily Hillary Bow, MD   325 mg at 01/01/16 0856  . gabapentin (NEURONTIN) capsule 100 mg  100 mg Oral QHS Hillary Bow, MD   100 mg at 12/31/15 2124  . insulin aspart (novoLOG) injection 0-20 Units  0-20 Units Subcutaneous TID WC Hillary Bow, MD   11 Units at 01/01/16 0857  . insulin aspart (novoLOG) injection 0-5 Units  0-5 Units Subcutaneous QHS Hillary Bow, MD   5 Units at 12/31/15 2128  . insulin detemir (LEVEMIR) injection 20 Units  20 Units Subcutaneous QHS Hillary Bow, MD   20 Units at 12/31/15 2130  . insulin detemir (LEVEMIR) injection 28 Units  28 Units Subcutaneous q morning - 10a Hillary Bow, MD   28 Units at 01/01/16 0858  . ipratropium-albuterol (DUONEB) 0.5-2.5 (3) MG/3ML nebulizer solution 3 mL  3 mL Nebulization Q4H Srikar Sudini, MD   3 mL at 01/01/16 0733  . methylPREDNISolone sodium succinate (SOLU-MEDROL) 125 mg/2 mL injection 60 mg  60 mg Intravenous Q12H Hillary Bow, MD   60 mg at 01/01/16 0554  . nitroGLYCERIN (NITROSTAT) SL tablet 0.4 mg  0.4 mg Sublingual Q5 min PRN Srikar Sudini, MD      . ondansetron (ZOFRAN) tablet 4 mg  4 mg Oral Q6H PRN Srikar Sudini, MD       Or  . ondansetron (ZOFRAN) injection 4 mg  4 mg Intravenous Q6H PRN Hillary Bow, MD   4 mg at 12/30/15 1323  . piperacillin-tazobactam  (ZOSYN) IVPB 3.375 g  3.375 g Intravenous Q12H Theodoro Grist, MD   3.375 g at 01/01/16 0858  .  polyethylene glycol (MIRALAX / GLYCOLAX) packet 17 g  17 g Oral Daily PRN Srikar Sudini, MD      . sodium chloride 0.9 % injection 3 mL  3 mL Intravenous Q12H Hillary Bow, MD   3 mL at 01/01/16 0859  . sodium chloride 0.9 % injection 3 mL  3 mL Intravenous Q12H Hillary Bow, MD   3 mL at 01/01/16 0858  . sodium chloride 0.9 % injection 3 mL  3 mL Intravenous PRN Hillary Bow, MD   3 mL at 12/30/15 0230  . sodium polystyrene (KAYEXALATE) 15 GM/60ML suspension 60 g  60 g Oral Once Lavonia Dana, MD      . vancomycin (VANCOCIN) IVPB 1000 mg/200 mL premix  1,000 mg Intravenous Q48H Theodoro Grist, MD          Allergies: Allergies  Allergen Reactions  . Ace Inhibitors   . Dristan Cold [Chlorphen-Pe-Acetaminophen]   . Iodine   . Keppra [Levetiracetam]   . Latex   . Other     ARB-angiotension receptor antsgonist All topical AG treatment  . Silver     silverdeen cream  . Statins       Past Medical History: Past Medical History  Diagnosis Date  . Hypokalemia   . UTI (lower urinary tract infection)   . Vaginitis   . Cognitive communication deficit   . Pacemaker   . Sleep apnea   . Hypertension   . Chronic kidney disease   . Malignant neoplasm ([place]) skin of breast  . Diabetes mellitus without complication ([place])   . Spinal stenosis   . Chronic foot ulcer ([place])   . A-fib ([place])   . Aortic stenosis, moderate   . Dementia   . PAD (peripheral artery disease) ([place])   . Seizure [facility])      Past Surgical History: No past surgical history on file.   Family History: Family History  Problem Relation Age of Onset  . Diabetes Mother   . Heart failure Sister      Social History: Social History   Social History  . Marital Status: Unknown    Spouse Name: N/A  . Number of Children: N/A  . Years of Education: N/A   Occupational History  . Not on file.   Social History Main  Topics  . Smoking status: Never Smoker   . Smokeless tobacco: Not on file  . Alcohol Use: Not on file  . Drug Use: Not on file  . Sexual Activity: Not on file   Other Topics Concern  . Not on file   Social History Narrative  . No narrative on file     Review of Systems: Review of Systems  Unable to perform ROS: dementia    Vital Signs: Blood pressure 137/58, pulse 60, temperature 98.7 F (37.1 C), temperature source Oral, resp. rate 18, height 5\' 5"  (1.651 m), weight 100.3 kg (221 lb 1.9 oz), SpO2 94 %.  Weight trends: Filed Weights   12/29/15 2213 12/31/15 0500 01/01/16 0553  Weight: 74.844 kg (165 lb) 98.4 kg (216 lb 14.9 oz) 100.3 kg (221 lb 1.9 oz)    Physical Exam: General: NAD, ill appearing  Head: Normocephalic, atraumatic. Moist oral mucosal membranes  Eyes: Anicteric, PERRL  Neck: Supple, trachea midline  Lungs:  dminished at bases, 2 L [place] O2  Heart: +murmur, +AICD, bradycardia  Abdomen:  Soft, nontender, obese  Extremities: trace peripheral edema.  Neurologic: Oriented to self only, moving all extremities, following commands  Skin: No  lesions        Lab results: Basic Metabolic Panel:  Recent Labs Lab 12/30/15 0326 12/31/15 0840 01/01/16 0446  NA 139 137 130*  K 4.2 5.5* 6.3*  CL 101 97* 88*  CO2 33* 32 29  GLUCOSE 333* 162* 320*  BUN 66* 88* 109*  CREATININE 1.83* 3.06* 4.10*  CALCIUM 8.5* 8.3* 8.1*    Liver Function Tests: No results for input(s): AST, ALT, ALKPHOS, BILITOT, PROT, ALBUMIN in Kylie last 168 hours. No results for input(s): LIPASE, AMYLASE in Kylie last 168 hours. No results for input(s): AMMONIA in Kylie last 168 hours.  CBC:  Recent Labs Lab 12/29/15 2240 12/30/15 0326 12/31/15 0840 01/01/16 0446  WBC 19.8* 20.3* 16.4* 14.1*  NEUTROABS 18.0*  --   --   --   HGB 10.8* 10.2* 10.1* 9.9*  HCT 33.4* 32.1* 31.4* 30.9*  MCV 90.5 89.9 90.6 91.7  PLT 232 191 179 189    Cardiac Enzymes:  Recent Labs Lab 12/29/15 2240  12/30/15 0326  TROPONINI 0.06* 0.09*    BNP: Invalid input(s): POCBNP  CBG:  Recent Labs Lab 12/31/15 0743 12/31/15 1203 12/31/15 1607 12/31/15 2103 01/01/16 0710  GLUCAP 144* 281* 323* 366* 297*    Microbiology: Results for orders placed or performed during Kylie hospital encounter of 12/29/15  Blood culture (routine x 2)     Status: None (Preliminary result)   Collection Time: 12/29/15 11:05 PM  Result Value Ref Range Status   Specimen Description BLOOD BLOOD RIGHT FOREARM  Final   Special Requests BOTTLES DRAWN AEROBIC AND ANAEROBIC 6CC  Final   Culture NO GROWTH < 12 HOURS  Final   Report Status PENDING  Incomplete  MRSA PCR Screening     Status: None   Collection Time: 12/30/15  1:52 AM  Result Value Ref Range Status   MRSA by PCR NEGATIVE NEGATIVE Final    Comment:        Kylie GeneXpert MRSA Assay (FDA approved for NASAL specimens only), is one component of a comprehensive MRSA colonization surveillance program. It is not intended to diagnose MRSA infection nor to guide or monitor treatment for MRSA infections.     Coagulation Studies: No results for input(s): LABPROT, INR in Kylie last 72 hours.  Urinalysis: No results for input(s): COLORURINE, LABSPEC, PHURINE, GLUCOSEU, HGBUR, BILIRUBINUR, KETONESUR, PROTEINUR, UROBILINOGEN, NITRITE, LEUKOCYTESUR in Kylie last 72 hours.  Invalid input(s): APPERANCEUR    Imaging: Dg Chest Port 1 View  01/01/2016  CLINICAL DATA:  Pneumonia, CHF, diabetes. EXAM: PORTABLE CHEST 1 VIEW COMPARISON:  Portable chest x-ray of December 29, 2015. FINDINGS: Kylie lungs are adequately inflated. Kylie pulmonary interstitial markings have improved bilaterally. Kylie retrocardiac region on Kylie left is less dense. Kylie lateral aspect of Kylie left hemidiaphragm is now visible. Kylie pulmonary vascularity is less engorged. Kylie permanent pacemaker is in stable position. Kylie mediastinum is normal in width. IMPRESSION: Improving CHF. Persistent left lower  lobe atelectasis. There is no large pleural effusion. When Kylie Martinez can tolerate Kylie procedure, a PA and lateral chest x-ray would be useful. Electronically Signed   By: David  Martinique M.D.   On: 01/01/2016 07:02      Assessment & Plan: Kylie Martinez is a 80 y.o. white female SNF resident with insulin dependent diabetes, hypertension, atrial fibrillation, aortic stenosis, systolic congestive heart failure, dementia, peripheral arterial disease, seizure disorder, obstructive sleep apnea , who was admitted to [facility] on 12/29/2015 for Acute respiratory failure with hypoxia ([place]) [J96.01]  1. Acute renal failure with hyperkalemia on chronic kidney disease stage III: baseline creatinine of 2.  Now with hyperkalemia, elevated BUN, hyponatremia.  Acute renal failure from concurrent illness: overdiuresis with furosemide along with hypotension related to pneumonia and acute exacerbation of CHF. No potassium given. Allergic to ACE-I/ARB.  However now oliguric.  BUN can be explained by systemic steroids.  Chronic kidney disease suspected to be due to diabetes and hypertension. Poor candidate for long term chronic dialysis. However she may need dialysis during this admission.  - Place foley catheter for accurate urine output measurement - Kayexalate now for acute hyperkalemia. Doubt bradycardia is due to elevated potassium. Follow up potassium level this afternoon - renal ultrasound has been ordered.  - For now, continue NS, monitor closely due to congestive heart failure and hyponatremia. New echo pending.  - Check urinalysis - continue to hold potassium chloride  2. Acute respiratory failure secondary to pneumonia and acute exacerbation of congestive heart failure Now getting NS due to concern of overdiuresis.  Weaned from Bipap to nasal canula.  - appreciate pulmonary input. Supportive measures, oxygen, and antibiotics.   3. Diabetes mellitus type II with chronic kidney disease: insulin  dependent: poorly controlled during this admission.   4. Hypertension: blood pressure has been at goal. Home regimen of furosemide, amlodipine - Currently on amlodipine.     LOS: [place], Macdonald Rigor 1/3/20179:24 AM

## 2016-01-01 NOTE — Progress Notes (Signed)
Inpatient Diabetes Program Recommendations  AACE/ADA: New Consensus Statement on Inpatient Glycemic Control (2015)  Target Ranges:  Prepandial:   less than 140 mg/dL      Peak postprandial:   less than 180 mg/dL (1-2 hours)      Critically ill patients:  140 - 180 mg/dL  Results for [name], Kylie Martinez (MRN [id number]) as of 01/01/2016 08:42  Ref. Range 12/31/2015 07:43 12/31/2015 12:03 12/31/2015 16:07 12/31/2015 21:03 01/01/2016 07:10  Glucose-Capillary Latest Ref Range: 65-99 mg/dL 144 (H) 281 (H) 323 (H) 366 (H) 297 (H)   Review of Glycemic Control  Current orders for Inpatient glycemic control: Levemir 20 units QHS, Levemir 28 units QAM, Novolog 0-20 units TID with meals, Novolog 0-5 units HS  Inpatient Diabetes Program Recommendations: Insulin - Basal: Fasting glucose 297 mg/dl this morning. If steroids are continued, please consider increasing Levemir to 34 units QAM and Levemir 25 units QHS.  Thanks, Barnie Alderman, RN, MSN, CDE Diabetes Coordinator Inpatient Diabetes Program [phone] (Team Pager from [place] to [place]) [phone] (AP office) [phone] [facility] office) [phone] [facility] office)

## 2016-01-01 NOTE — Progress Notes (Signed)
On left foot third toe is amputated. Dressing removed. Wound base is dry. Dressing remove was iodoform covered with foam adhesive dressing. Wound measured from greatest length by greatest height head to toe: Length 1.4 cm x 1 cm width x 0.4 cm deep. Repacked wound with iodoform and covered with 2x2 dressing and secured with paper tape. Advised nurse to follow up with wound care nurse tomorrow. Stated Rx needed to add moisture to wound for healing.  Dillan Candela, RN, W.T.A.

## 2016-01-01 NOTE — Progress Notes (Addendum)
[place] [facility] [place] NAME: Lashonne Fletes    MR#:  [id number]  DATE OF BIRTH:  01-Mar-1936  SUBJECTIVE:  CHIEF COMPLAINT:   Chief Complaint  Patient presents with  . Respiratory Distress   -Patient with respiratory distress, diuresed for CHF. -Now went into acute renal failure, has been oliguric since yesterday. -Worsening creatinine and also potassium  REVIEW OF SYSTEMS:  Review of Systems  Constitutional: Negative for fever and chills.  Respiratory: Negative for cough, shortness of breath and wheezing.   Cardiovascular: Negative for chest pain and palpitations.  Gastrointestinal: Negative for nausea, vomiting, abdominal pain, diarrhea and constipation.  Genitourinary: Negative for dysuria.       Oliguric  Musculoskeletal: Positive for myalgias.  Neurological: Positive for weakness. Negative for dizziness, seizures and headaches.    DRUG ALLERGIES:   Allergies  Allergen Reactions  . Ace Inhibitors   . Dristan Cold [Chlorphen-Pe-Acetaminophen]   . Iodine   . Keppra [Levetiracetam]   . Latex   . Other     ARB-angiotension receptor antsgonist All topical AG treatment  . Silver     silverdeen cream  . Statins     VITALS:  Blood pressure 136/62, pulse 59, temperature 97.8 F (36.6 C), temperature source Axillary, resp. rate 18, height 5\' 5"  (1.651 m), weight 100.3 kg (221 lb 1.9 oz), SpO2 97 %.  PHYSICAL EXAMINATION:  Physical Exam  GENERAL:  80 y.o.-year-old patient lying in the bed with no acute distress.  EYES: Pupils equal, round, reactive to light and accommodation. No scleral icterus. Extraocular muscles intact.  HEENT: Head atraumatic, normocephalic. Oropharynx and nasopharynx clear.  NECK:  Supple, no jugular venous distention. No thyroid enlargement, no tenderness.  LUNGS: Normal breath sounds bilaterally, no wheezing, rales,rhonchi or crepitation. No use of accessory muscles of respiration. Decreased  bibasilar breath sounds CARDIOVASCULAR: S1, S2 normal. No murmurs, rubs, or gallops.  ABDOMEN: Soft, nontender, nondistended. Bowel sounds present. No organomegaly or mass.  EXTREMITIES: No pedal edema, cyanosis, or clubbing.  NEUROLOGIC: Cranial nerves II through XII are intact. Muscle strength 5/5 in all extremities. Sensation intact. Gait not checked.  PSYCHIATRIC: The patient is alert and pleasantly confused.  SKIN: No obvious rash, lesion, or ulcer.    LABORATORY PANEL:   CBC  Recent Labs Lab 01/01/16 0446  WBC 14.1*  HGB 9.9*  HCT 30.9*  PLT 189   ------------------------------------------------------------------------------------------------------------------  Chemistries   Recent Labs Lab 01/01/16 0446  NA 130*  K 6.3*  CL 88*  CO2 29  GLUCOSE 320*  BUN 109*  CREATININE 4.10*  CALCIUM 8.1*   ------------------------------------------------------------------------------------------------------------------  Cardiac Enzymes  Recent Labs Lab 12/30/15 0326  TROPONINI 0.09*   ------------------------------------------------------------------------------------------------------------------  RADIOLOGY:  Dg Chest Port 1 View  01/01/2016  CLINICAL DATA:  Pneumonia, CHF, diabetes. EXAM: PORTABLE CHEST 1 VIEW COMPARISON:  Portable chest x-ray of December 29, 2015. FINDINGS: The lungs are adequately inflated. The pulmonary interstitial markings have improved bilaterally. The retrocardiac region on the left is less dense. The lateral aspect of the left hemidiaphragm is now visible. The pulmonary vascularity is less engorged. The permanent pacemaker is in stable position. The mediastinum is normal in width. IMPRESSION: Improving CHF. Persistent left lower lobe atelectasis. There is no large pleural effusion. When the patient can tolerate the procedure, a PA and lateral chest x-ray would be useful. Electronically Signed   By: David  Martinique M.D.   On: 01/01/2016 07:02     EKG:  Orders placed or performed during the hospital encounter of 12/29/15  . ED EKG  . ED EKG  . EKG 12-Lead  . EKG 12-Lead    ASSESSMENT AND PLAN:     1. Acute on chronic systolic congestive heart failure with acute pulmonary edema, resolved, discontinue diuresis with Lasix due to renal failure. Repeat chest x-ray with improvement. -Echocardiogram with EF of 55%, improved when compared to prior echo. LVH noted and possibly diastolic dysfunction.  2. Bacterial pneumonia, healthcare associated, was on broad-spectrum antibiotics. Blood cultures have remained negative. -Improving left pleural effusion. Only atelectasis on chest x-ray. Antibiotics have been discontinued. -Encouraged incentive spirometry.  -3. Elevated troponin, likely demand ischemia. Echocardiogram in May 2016 revealed LVH, ejection fraction of 45-50%, aortic stenosis, elevated central venous in the right atrial pressures.   4 . Acute on chronic Renal failure- worsened with diuresis. -No urgent indication for hemodialysis at this time. Might need hemodialysis during this admission. -We will need to discuss with family. Potassium is elevated-Kayexalate given. -Gentle hydration today and hold Lasix. -Appreciate nephrology consult. -Renal ultrasound is pending. -Foley catheter for strict input and output monitoring.   5. IV recent mellitus-continue Levemir and also sliding scale insulin.  6. Hypertension-on Norvasc  7. DVT prophylaxis-on subcutaneous heparin   Physical therapy consult when able to. -If stable, can be transferred out of ICU.  All the records are reviewed and case discussed with Care Management/Social Workerr. Management plans discussed with the patient, family and they are in agreement.  CODE STATUS: Full code  TOTAL CRITICAL CARE TIME SPENT IN TAKING CARE OF THIS PATIENT: 6minutes.   POSSIBLE D/C IN 3DAYS, DEPENDING ON CLINICAL CONDITION.   Gladstone Lighter M.D on 01/01/2016 at  4:46 PM  Between 7am to 6pm - Pager - [phone]  After 6pm go to [email or web address] - password [email or web address] [place] [facility]  Office  [phone]  CC: Primary care physician; Juluis Pitch, MD

## 2016-01-01 NOTE — Clinical Social Work Note (Addendum)
Clinical Social Work Assessment  Patient Details  Name: Kylie Martinez MRN: [id number] Date of Birth: 1936-04-07  Date of referral:  01/01/16               Reason for consult:  Discharge Planning (Patient is a resident at [place] )                Permission sought to share information with:  Other, Case Manager Permission granted to share information::  Yes, Verbal Permission Granted  Name::        Agency::   ([facility] )  Relationship::   (Kylie Martinez (Daughter/ [place]) [phone])  Contact Information:     Housing/Transportation Living arrangements for the past 2 months:  [place] ([facility] ) Source of Information:  Patient, Facility, Adult Children Patient Interpreter Needed:  None Criminal Activity/Legal Involvement Pertinent to Current Situation/Hospitalization:  No - Comment as needed Significant Relationships:  Adult Children Lives with:  Facility Resident ([facility] ) Do you feel safe going back to the place where you live?  Yes Need for family participation in patient care:  Yes (Comment) Kylie Martinez (Daughter/ [place]) [phone])  Care giving concerns:  Patient is a [facility] resident.    Social Worker assessment / plan:  CSW met with patient at bedside. Patient was sitting up in the bed. Patient was oriented to self. Patient reports that she lives at [employer]. Per patient her daughter visited her. Patient discussed how she enjoyed helping people and tries not to be a burden on others. Patient informed CSW that her daughter was 41 or 94 years old. She reports that she has a husband and that he supported her. Patient was pleasant.   CSW received a phone call form patient's daughter. She informed CSW that she's patient's legal guardian. She reports that patient will return to [facility] at discharge by EMS. Per Patient's daughter she will be going to [facility] new facility to "set up" patient's room  and to decrease patient's confusion in the transition. She reports that she is fearful that patient's needs could be "lost in the shuffle" if she is discharged today to [facility] during this transition period. Patient's daughter requested that specific and clear Bi-PAP plans be documented on patient's discharge summary. She reports that patient's Bi-PAP was not being implemented correctly and resulted in patient's hospitalization.   CSW contacted Broadus John, admissions coordinator at [facility]. He reports that patient has a Bi-PAP at [facility]. Per Broadus John patient has been a resident at [facility] over the past 8 months. CSW requested legal guardianship paperwork from [place]. He reported that he'd fax it to [place].   CSW will continue to follow and assist.   Kylie Martinez, MSW, LCSW-A Clinical Social Work Department [phone]  Employment status:  Retired Forensic scientist:  Information systems manager, Medicaid In [place] PT Recommendations:  Not assessed at this time Information / Referral to community resources:     Patient/Family's Response to care:  Patient's daughter is agreeable to discharge plan. Patient will return to [facility] at discharge.   Patient/Family's Understanding of and Emotional Response to Diagnosis, Current Treatment, and Prognosis:  Patient was pleasant. She presented in a good mood and oriented to self. Patient's daughter was pleasant and concerned about Bi-PAP usage at [facility]. She was grateful for CSW assistance with discharge coordination.   Emotional Assessment Appearance:  Appears stated age Attitude/Demeanor/Rapport:   (None) Affect (typically observed):  Happy, Calm Orientation:  Alcohol / Substance use:  Not Applicable Psych involvement (Current and /or in the community):  No (Comment)  Discharge Needs  Concerns to be addressed:  Discharge Planning Concerns (Patient is a [facility] resident) Readmission within the last 30 days:  No Current discharge risk:  Chronically ill Barriers to  Discharge:  Continued Medical Work up   [employer], LCSW 01/01/2016, 1:56 PM

## 2016-01-02 ENCOUNTER — Inpatient Hospital Stay: Payer: Medicare Other

## 2016-01-02 LAB — GLUCOSE, CAPILLARY
GLUCOSE-CAPILLARY: 60 mg/dL — AB (ref 65–99)
GLUCOSE-CAPILLARY: 52 mg/dL — AB (ref 65–99)
GLUCOSE-CAPILLARY: 66 mg/dL (ref 65–99)
GLUCOSE-CAPILLARY: 130 mg/dL — AB (ref 65–99)
GLUCOSE-CAPILLARY: 184 mg/dL — AB (ref 65–99)
Glucose-Capillary: 80 mg/dL (ref 65–99)
Glucose-Capillary: 107 mg/dL — ABNORMAL HIGH (ref 65–99)

## 2016-01-02 LAB — RENAL FUNCTION PANEL
Albumin: 2.9 g/dL — ABNORMAL LOW (ref 3.5–5.0)
Anion gap: 13 (ref 5–15)
BUN: 121 mg/dL — ABNORMAL HIGH (ref 6–20)
CALCIUM: 7.7 mg/dL — AB (ref 8.9–10.3)
CO2: 29 mmol/L (ref 22–32)
CREATININE: 4.76 mg/dL — AB (ref 0.44–1.00)
Chloride: 92 mmol/L — ABNORMAL LOW (ref 101–111)
GFR, EST AFRICAN AMERICAN: 9 mL/min — AB (ref >60)
GFR, EST NON AFRICAN AMERICAN: 8 mL/min — AB (ref >60)
Glucose, Bld: 73 mg/dL (ref 65–99)
PHOSPHORUS: 6.3 mg/dL — AB (ref 2.5–4.6)
Potassium: 5.4 mmol/L — ABNORMAL HIGH (ref 3.5–5.1)
SODIUM: 134 mmol/L — AB (ref 135–145)

## 2016-01-02 MED ORDER — DEXTROSE 50 % IV SOLN
INTRAVENOUS | Status: AC
Start: 2016-01-02 — End: 2016-01-02
  Administered 2016-01-02: 50 mL via INTRAVENOUS
  Filled 2016-01-02: qty 50

## 2016-01-02 MED ORDER — DEXTROSE 50 % IV SOLN
50.0000 mL | INTRAVENOUS | Status: AC
  Administered 2016-01-02: 50 mL via INTRAVENOUS

## 2016-01-02 MED ORDER — DEXTROSE 50 % IV SOLN
50.0000 mL | INTRAVENOUS | Status: AC
Start: 2016-01-02 — End: 2016-01-02
  Administered 2016-01-02: 50 mL via INTRAVENOUS

## 2016-01-02 MED ORDER — DEXTROSE 50 % IV SOLN
INTRAVENOUS | Status: AC
  Administered 2016-01-02: 50 mL via INTRAVENOUS
  Filled 2016-01-02: qty 50

## 2016-01-02 MED ORDER — DEXTROSE-NACL 5-0.45 % IV SOLN
INTRAVENOUS | Status: DC
  Administered 2016-01-02 – 2016-01-07 (×5): via INTRAVENOUS

## 2016-01-02 NOTE — Progress Notes (Signed)
Notified Dr. Romero Belling that patients blood sugar was 60 this morning and I gave her 2 orange juices. Blood sugar is now 80. Patient has Levemir 28 units ordered at this time. Dr. Romero Belling stated to hold this dose for now and she would review and adjust dosage. No other changes noted. Will continue to assess and monitor.

## 2016-01-02 NOTE — Progress Notes (Signed)
Clinical Education officer, museum (CSW) contacted [employer] and made him aware that patient's daughter is concerned about the Bi-pap settings at [facility]. CSW will continue to follow and assist as needed.   Blima Rich, [place] [phone]

## 2016-01-02 NOTE — Consult Note (Addendum)
WOC wound consult note completed via [employer].  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  Pressure Ulcer POA: No Measurement: right great toe: 2.5cm x 1.5cm x 0; left great toe: 4cm x 3cm x 0cm; left 2nd toe: 2cm x 3cm x 0cm  Wound bed: all measurable areas right great toe, left great toe, and left 2nd toe 100% dry hard eschar Drainage (amount, consistency, odor) none Periwound: intact  Dressing procedure/placement/frequency: Toe tip ulcers are dry and stable, will leave them open to air with history of previous amputation would not want to open these areas.  Per review of the chart, noted daughter thinks they were using Santyl and iodoform packing.  The 3rd toe amputation site is clean, healed. No real need for packing at this time.  Will continue dry dressing to this area to protect.  Would not suggest use of enzymatic debridement ointment anywhere, best practice would be to keep the toes dry and stable if possible, to avoid opening any areas that may likely not heal.  Called [facility] to discuss POC established in that facility per notes from daughter they have that information, awaiting call back.  Will update Andee Lineman per daughter's request once I have heard from outside facility.  Discussed POC with patient and bedside nurse.  Re consult if needed, will not follow at this time. Thanks  Daphane Odekirk [place], [place] [phone])    Addendum:  Call from Abigail Butts at [facility] the treatment nurse. She reports they are wiping the dry eschar with skin prep and leaving open to air.  They have been packing the 3rd toe amputation site with iodoform and covering with calcium alginate, however today at the time of my assessment there is no wound present at this site.  She also reports wound that that are packing under her 2nd toe on the left foot, which also has no  open areas that the bedside nurse can appreciate at the time of our assessment today.   Notified patients daughter Jenny Reichmann of above findings and planned treatment.  LVM on cell phone provided.   Marica Otter RN, CWOCN 13:34

## 2016-01-02 NOTE — Progress Notes (Signed)
[facility]  ROUNDING NOTE   Subjective:   UOP 435 - foley placed. Apparently family was not happy about foley catheter being placed.   Breathing room air  Creatinine 4.76 (4.71) BUN 121 (123) K 5.4 (5.7)    Objective:  Vital signs in last 24 hours:  Temp:  [97.4 F (36.3 C)-98.1 F (36.7 C)] 97.5 F (36.4 C) (01/04 0800) Pulse Rate:  [55-60] 59 (01/04 0800) Resp:  [14-25] 15 (01/04 0800) BP: (130-163)/(56-93) 138/64 mmHg (01/04 0800) SpO2:  [94 %-100 %] 95 % (01/04 0800) FiO2 (%):  [25 %] 25 % (01/03 1800) Weight:  [102.8 kg (226 lb 10.1 oz)] 102.8 kg (226 lb 10.1 oz) (01/04 0500)  Weight change: 2.5 kg (5 lb 8.2 oz) Filed Weights   12/31/15 0500 01/01/16 0553 01/02/16 0500  Weight: 98.4 kg (216 lb 14.9 oz) 100.3 kg (221 lb 1.9 oz) 102.8 kg (226 lb 10.1 oz)    Intake/Output: I/O last 3 completed shifts: In: 1772.5 [P.O.:580; I.V.:1092.5; IV Piggyback:100] Out: 435 [Urine:435]   Intake/Output this shift:  Total I/O In: 50 [I.V.:50] Out: 110 [Urine:110]  Physical Exam: General: Laying in bed, breathing room air, NAD  Head: Normocephalic, atraumatic. Moist oral mucosal membranes  Eyes: Anicteric, PERRL  Neck: Supple, trachea midline  Lungs:  Clear to auscultation  Heart: Bradycardia,+murmur  Abdomen:  Soft, nontender  Extremities: no peripheral edema. Gangrenous changes to several toe bilaterally  Neurologic: Pleasant, alert to self only  Skin: No lesions  GU Foley+    Basic Metabolic Panel:  Recent Labs Lab 12/30/15 0326 12/31/15 0840 01/01/16 0446 01/01/16 1607 01/02/16 0534  NA 139 137 130* 128* 134*  K 4.2 5.5* 6.3* 5.7* 5.4*  CL 101 97* 88* 89* 92*  CO2 33* 32 29 26 29   GLUCOSE 333* 162* 320* 286* 73  BUN 66* 88* 109* 123* 121*  CREATININE 1.83* 3.06* 4.10* 4.71* 4.76*  CALCIUM 8.5* 8.3* 8.1* 7.9* 7.7*  PHOS  --   --   --   --  6.3*    Liver Function Tests:  Recent Labs Lab 01/02/16 0534  ALBUMIN 2.9*   No results for  input(s): LIPASE, AMYLASE in the last 168 hours. No results for input(s): AMMONIA in the last 168 hours.  CBC:  Recent Labs Lab 12/29/15 2240 12/30/15 0326 12/31/15 0840 01/01/16 0446  WBC 19.8* 20.3* 16.4* 14.1*  NEUTROABS 18.0*  --   --   --   HGB 10.8* 10.2* 10.1* 9.9*  HCT 33.4* 32.1* 31.4* 30.9*  MCV 90.5 89.9 90.6 91.7  PLT 232 191 179 189    Cardiac Enzymes:  Recent Labs Lab 12/29/15 2240 12/30/15 0326  TROPONINI 0.06* 0.09*    BNP: Invalid input(s): POCBNP  CBG:  Recent Labs Lab 01/01/16 0710 01/01/16 1120 01/01/16 1602 01/01/16 2108 01/02/16 0717  GLUCAP 297* 345* 260* 165* 1*    Microbiology: Results for orders placed or performed during the hospital encounter of 12/29/15  Blood culture (routine x 2)     Status: None (Preliminary result)   Collection Time: 12/29/15 11:05 PM  Result Value Ref Range Status   Specimen Description BLOOD BLOOD RIGHT FOREARM  Final   Special Requests BOTTLES DRAWN AEROBIC AND ANAEROBIC 6CC  Final   Culture NO GROWTH 3 DAYS  Final   Report Status PENDING  Incomplete  MRSA PCR Screening     Status: None   Collection Time: 12/30/15  1:52 AM  Result Value Ref Range Status   MRSA  by PCR NEGATIVE NEGATIVE Final    Comment:        The GeneXpert MRSA Assay (FDA approved for NASAL specimens only), is one component of a comprehensive MRSA colonization surveillance program. It is not intended to diagnose MRSA infection nor to guide or monitor treatment for MRSA infections.     Coagulation Studies: No results for input(s): LABPROT, INR in the last 72 hours.  Urinalysis:  Recent Labs  01/01/16 1100  COLORURINE YELLOW*  LABSPEC 1.019  PHURINE 5.0  GLUCOSEU 50*  HGBUR 3+*  BILIRUBINUR NEGATIVE  KETONESUR NEGATIVE  PROTEINUR NEGATIVE  NITRITE NEGATIVE  LEUKOCYTESUR TRACE*      Imaging: Dg Chest Port 1 View  01/01/2016  CLINICAL DATA:  Pneumonia, CHF, diabetes. EXAM: PORTABLE CHEST 1 VIEW COMPARISON:   Portable chest x-ray of December 29, 2015. FINDINGS: The lungs are adequately inflated. The pulmonary interstitial markings have improved bilaterally. The retrocardiac region on the left is less dense. The lateral aspect of the left hemidiaphragm is now visible. The pulmonary vascularity is less engorged. The permanent pacemaker is in stable position. The mediastinum is normal in width. IMPRESSION: Improving CHF. Persistent left lower lobe atelectasis. There is no large pleural effusion. When the patient can tolerate the procedure, a PA and lateral chest x-ray would be useful. Electronically Signed   By: David  Martinique M.D.   On: 01/01/2016 07:02     Medications:   . sodium chloride 50 mL/hr at 01/02/16 0700   . acidophilus  1 capsule Oral Daily  . amLODipine  10 mg Oral Daily  . antiseptic oral rinse  7 mL Mouth Rinse q12n4p  . aspirin EC  81 mg Oral Daily  . chlorhexidine  15 mL Mouth Rinse BID  . docusate sodium  100 mg Oral BID  . ferrous sulfate  325 mg Oral Daily  . gabapentin  100 mg Oral QHS  . heparin subcutaneous  5,000 Units Subcutaneous 3 times per day  . insulin aspart  0-20 Units Subcutaneous TID WC  . insulin aspart  0-5 Units Subcutaneous QHS  . insulin detemir  20 Units Subcutaneous QHS  . insulin detemir  28 Units Subcutaneous q morning - 10a  . sodium chloride  3 mL Intravenous Q12H  . sodium chloride  3 mL Intravenous Q12H   sodium chloride, acetaminophen **OR** acetaminophen, bisacodyl, ipratropium-albuterol, nitroGLYCERIN, ondansetron **OR** ondansetron (ZOFRAN) IV, polyethylene glycol, sodium chloride  Assessment/ Plan:  Ms. Azalee Ponds is a 80 y.o. white female SNF resident with insulin dependent diabetes mellitus type II, hypertension, atrial fibrillation, aortic stenosis, systolic congestive heart failure, dementia, peripheral arterial disease, seizure disorder, obstructive sleep apnea , who was admitted to [facility] on 12/29/2015 for Acute respiratory failure with  hypoxia ([place]) [J96.01]   1. Acute renal failure with hyperkalemia on chronic kidney disease stage III: baseline creatinine of 2.  Now with hyperkalemia, elevated BUN, hyponatremia.  Acute renal failure from concurrent illness: overdiuresis with furosemide along with hypotension related to pneumonia and acute exacerbation of CHF. No potassium given. Allergic to ACE-I/ARB.  BUN can be explained by systemic steroids.  Hyperkalemia treated with kayexalate on 1/3.  Urine output, sodium, potassium all improving. BUN has not changed much.  Chronic kidney disease suspected to be due to diabetes and hypertension. Poor candidate for long term chronic dialysis. However she may need dialysis during this admission.  - Attempted to contact family, however no response. At this time, will hold off on dialysis.  - Placed foley catheter for  accurate urine output measurement - renal ultrasound reviewed.  - For now, continue NS, monitor closely due to congestive heart failure and hyponatremia.   2. Acute respiratory failure secondary to pneumonia and acute exacerbation of congestive heart failure. Weaned to room air from BiPap Now getting NS due to concern of overdiuresis.  - appreciate pulmonary input. Supportive measures, oxygen, and antibiotics.   3. Diabetes mellitus type II with chronic kidney disease: insulin dependent: poorly controlled during this admission.   4. Hypertension: blood pressure has been at goal. Home regimen of furosemide, amlodipine - Currently not on any agents.    LOS: [place], [place] 1/4/20179:16 AM

## 2016-01-02 NOTE — Progress Notes (Signed)
Notified Dr. Tressia Miners that patients blood sugar was 52. Per protocol I ordered and administered 82ml of dextrose IV. Also let her know that patients oxygen saturation dropped to 84% on room air while resting after her morning tray. Patient placed back on BIPAP and oxygen saturations have been within normal limits. No other changes noted, will continue to assess and monitor.

## 2016-01-02 NOTE — Progress Notes (Signed)
Inpatient Diabetes Program Recommendations  AACE/ADA: New Consensus Statement on Inpatient Glycemic Control (2015)  Target Ranges:  Prepandial:   less than 140 mg/dL      [facility] postprandial:   less than 180 mg/dL (1-2 hours)      Critically ill patients:  140 - 180 mg/dL  Results for KAVINA, LINTS (MRN [id number]) as of 01/02/2016 08:21  Ref. Range 01/01/2016 07:10 01/01/2016 11:20 01/01/2016 16:02 01/01/2016 21:08 01/02/2016 07:17  Glucose-Capillary Latest Ref Range: 65-99 mg/dL 297 (H) 345 (H) 260 (H) 165 (H) 60 (L)   Review of Glycemic Control  Current orders for Inpatient glycemic control: Levemir 20 units QHS, Levemir 28 units QAM, Novolog 0-20 units TID with meals, Novolog 0-5 units HS  Inpatient Diabetes Program Recommendations: Insulin - Basal: Noted steroids have been stopped, fasting glucose is 60 mg/dl this morning, and worsening renal function. May want to consider decreasing bedtime Levemir dose from 20 units to 15 units QHS.    Thanks, Barnie Alderman, RN, MSN, CDE Diabetes Coordinator Inpatient Diabetes Program [phone] (Team Pager from [place] to [place]) [phone] ([place] office) [phone] [facility] office) [phone] [facility] office)

## 2016-01-02 NOTE — Progress Notes (Signed)
[facility] [place] NAME: Kylie Martinez    MR#:  [id number]  DATE OF BIRTH:  [birth year]  SUBJECTIVE:  CHIEF COMPLAINT:   Chief Complaint  Patient presents with  . Respiratory Distress   - worse renal function, potassium still elevated- better than yesterday - alert, remains on Bipap- daughter wants her on bipap continuously except for eating- which is what patient was doing at [facility] per daughter. - sugars have been low today  REVIEW OF SYSTEMS:  Review of Systems  Constitutional: Negative for fever and chills.  Respiratory: Negative for cough, shortness of breath and wheezing.   Cardiovascular: Negative for chest pain and palpitations.  Gastrointestinal: Negative for nausea, vomiting, abdominal pain, diarrhea and constipation.  Genitourinary: Negative for dysuria.       Oliguric  Musculoskeletal: Positive for myalgias.  Neurological: Positive for weakness. Negative for dizziness, seizures and headaches.    DRUG ALLERGIES:   Allergies  Allergen Reactions  . Ace Inhibitors   . Dristan Cold [Chlorphen-Pe-Acetaminophen]   . Iodine   . Keppra [Levetiracetam]   . Latex   . Other     ARB-angiotension receptor antsgonist All topical AG treatment  . Silver     silverdeen cream  . Statins     VITALS:  Blood pressure 124/66, pulse 62, temperature 97.5 F (36.4 C), temperature source Axillary, resp. rate 19, height 5\' 5"  (1.651 m), weight 102.8 kg (226 lb 10.1 oz), SpO2 96 %.  PHYSICAL EXAMINATION:  Physical Exam  GENERAL:  80 y.o.-year-old patient lying in the bed with no acute distress.  On bipap now. EYES: Pupils equal, round, reactive to light and accommodation. No scleral icterus. Extraocular muscles intact.  HEENT: Head atraumatic, normocephalic. Oropharynx and nasopharynx clear.  NECK:  Supple, no jugular venous distention. No thyroid enlargement, no tenderness.  LUNGS: Normal breath sounds bilaterally,  no wheezing, rales,rhonchi or crepitation. No use of accessory muscles of respiration. Decreased bibasilar breath sounds CARDIOVASCULAR: S1, S2 normal. No murmurs, rubs, or gallops.  ABDOMEN: Soft, nontender, nondistended. Bowel sounds present. No organomegaly or mass.  EXTREMITIES: No pedal edema, cyanosis, or clubbing.  NEUROLOGIC: Cranial nerves II through XII are intact. Muscle strength 5/5 in all extremities. Sensation intact. Gait not checked.  PSYCHIATRIC: The patient is alert and pleasantly confused.  SKIN: No obvious rash, lesion, or ulcer.    LABORATORY PANEL:   CBC  Recent Labs Lab 01/01/16 0446  WBC 14.1*  HGB 9.9*  HCT 30.9*  PLT 189   ------------------------------------------------------------------------------------------------------------------  Chemistries   Recent Labs Lab 01/02/16 0534  NA 134*  K 5.4*  CL 92*  CO2 29  GLUCOSE 73  BUN 121*  CREATININE 4.76*  CALCIUM 7.7*   ------------------------------------------------------------------------------------------------------------------  Cardiac Enzymes  Recent Labs Lab 12/30/15 0326  TROPONINI 0.09*   ------------------------------------------------------------------------------------------------------------------  RADIOLOGY:  US Renal  01/02/2016  CLINICAL DATA:  Acute renal failure EXAM: RENAL / URINARY TRACT ULTRASOUND COMPLETE COMPARISON:  None. FINDINGS: Right Kidney: Length: 9.6 cm. Echogenicity within normal limits. No mass or hydronephrosis visualized. Probable linear small vascular calcifications are noted in midpole. Left Kidney: Length: 10.9 cm. Echogenicity within normal limits. No mass or hydronephrosis visualized. Probable small vascular calcifications are noted Bladder: Urinary bladder is decompressed with Foley catheter. IMPRESSION: 1. No hydronephrosis. No focal renal mass. Probable bilateral renal small vascular calcifications. Decompressed urinary bladder with Foley catheter.  Electronically Signed   By: Lahoma Crocker M.D.   On: 01/02/2016 10:44  Dg Chest Port 1 View  01/01/2016  CLINICAL DATA:  Pneumonia, CHF, diabetes. EXAM: PORTABLE CHEST 1 VIEW COMPARISON:  Portable chest x-ray of December 29, 2015. FINDINGS: The lungs are adequately inflated. The pulmonary interstitial markings have improved bilaterally. The retrocardiac region on the left is less dense. The lateral aspect of the left hemidiaphragm is now visible. The pulmonary vascularity is less engorged. The permanent pacemaker is in stable position. The mediastinum is normal in width. IMPRESSION: Improving CHF. Persistent left lower lobe atelectasis. There is no large pleural effusion. When the patient can tolerate the procedure, a PA and lateral chest x-ray would be useful. Electronically Signed   By: David  Martinique M.D.   On: 01/01/2016 07:02    EKG:   Orders placed or performed during the hospital encounter of 12/29/15  . ED EKG  . ED EKG  . EKG 12-Lead  . EKG 12-Lead    ASSESSMENT AND PLAN:     1. Acute on chronic systolic congestive heart failure with acute pulmonary edema, resolved, discontinue diuresis with Lasix due to renal failure. Repeat chest x-ray with improvement. -Echocardiogram with EF of 55%, improved when compared to prior echo. LVH noted and possibly diastolic dysfunction. Patient has chronic respiratory failure and on Bipap continuously. According to daughter, she uses BiPAP continuously at the rehabilitation.  2. Bacterial pneumonia, healthcare associated, was on broad-spectrum antibiotics. Blood cultures have remained negative. -Improving left pleural effusion. Only atelectasis on chest x-ray. Antibiotics have been discontinued. -Encouraged incentive spirometry.  -3. Elevated troponin, likely demand ischemia. Echocardiogram in May 2016 revealed LVH, ejection fraction of 45-50%, aortic stenosis, elevated central venous in the right atrial pressures.   4 . Acute on chronic Renal  failure- worsened with diuresis. -Appreciate nephrology consult. Family refused any dialysis. -Potassium elevated, better than yesterday though. Kayexalate was given yesterday. No bowel movement yet. -Gentle hydration and hold Lasix. -Appreciate nephrology consult. -Renal ultrasound with no hydronephrosis.. -Foley catheter for strict input and output monitoring.   5. diabetes mellitus-lead sugars were low today. Hold Levemir twice a day. Continue sliding scale insulin. -Change fluids to D5 half normal saline  6. Hypertension-on Norvasc  7. DVT prophylaxis-on subcutaneous heparin   Physical therapy consult when able to. -can be transferred out of ICU.  All the records are reviewed and case discussed with Care Management/Social Workerr. Management plans discussed with the patient, family and they are in agreement.  CODE STATUS: Full code  TOTAL CRITICAL CARE TIME SPENT IN TAKING CARE OF THIS PATIENT: 73minutes.   POSSIBLE D/C IN 3DAYS, DEPENDING ON CLINICAL CONDITION.   Gladstone Lighter M.D on 01/02/2016 at 12:59 PM  Between 7am to 6pm - Pager - [phone]  After 6pm go to [email or web address] - password [email or web address] [place] [facility]  Office  [phone]  CC: Primary care physician; Juluis Pitch, MD

## 2016-01-02 NOTE — Progress Notes (Signed)
Notified Dr. Tressia Miners that patients blood sugar was low again at 66. Another dose of 50% Dextrose given IV. Patient has not eaten much today and she has been resting with the BIPAP on for majority of the day. While administering the dextrose patient was alert, pleasant, no complaints of discomfort, and denies any trouble breathing. Will recheck her blood sugar and if it increases she agreed to get up to the chair to try and eat some dinner. No other changes noted at this time. Will continue to assess and monitor.

## 2016-01-03 LAB — GLUCOSE, CAPILLARY
GLUCOSE-CAPILLARY: 215 mg/dL — AB (ref 65–99)
GLUCOSE-CAPILLARY: 289 mg/dL — AB (ref 65–99)
GLUCOSE-CAPILLARY: 171 mg/dL — AB (ref 65–99)
Glucose-Capillary: 96 mg/dL (ref 65–99)

## 2016-01-03 LAB — CULTURE, BLOOD (ROUTINE X 2): Culture: NO GROWTH

## 2016-01-03 LAB — BASIC METABOLIC PANEL
Anion gap: 14 (ref 5–15)
BUN: 127 mg/dL — AB (ref 6–20)
CHLORIDE: 94 mmol/L — AB (ref 101–111)
CO2: 25 mmol/L (ref 22–32)
CREATININE: 4.53 mg/dL — AB (ref 0.44–1.00)
Calcium: 7.2 mg/dL — ABNORMAL LOW (ref 8.9–10.3)
GFR calc Af Amer: 10 mL/min — ABNORMAL LOW (ref >60)
GFR calc non Af Amer: 9 mL/min — ABNORMAL LOW (ref >60)
GLUCOSE: 97 mg/dL (ref 65–99)
POTASSIUM: 4 mmol/L (ref 3.5–5.1)
SODIUM: 133 mmol/L — AB (ref 135–145)

## 2016-01-03 LAB — PHOSPHORUS: Phosphorus: 6.9 mg/dL — ABNORMAL HIGH (ref 2.5–4.6)

## 2016-01-03 LAB — ALBUMIN: Albumin: 2.8 g/dL — ABNORMAL LOW (ref 3.5–5.0)

## 2016-01-03 NOTE — Progress Notes (Signed)
[facility]  ROUNDING NOTE   Subjective:   Sitting up in bed, eating breakfast.  Breathing room air  No labs this morning.   Objective:  Vital signs in last 24 hours:  Temp:  [97.7 F (36.5 C)-98.7 F (37.1 C)] 98.2 F (36.8 C) (01/05 0515) Pulse Rate:  [55-62] 59 (01/05 0600) Resp:  [7-24] 16 (01/05 0600) BP: (107-152)/(53-99) 152/70 mmHg (01/05 0600) SpO2:  [84 %-100 %] 100 % (01/05 0600) FiO2 (%):  [25 %] 25 % (01/04 1600) Weight:  [104.3 kg (229 lb 15 oz)] 104.3 kg (229 lb 15 oz) (01/05 0515)  Weight change: 1.5 kg (3 lb 4.9 oz) Filed Weights   01/01/16 0553 01/02/16 0500 01/03/16 0515  Weight: 100.3 kg (221 lb 1.9 oz) 102.8 kg (226 lb 10.1 oz) 104.3 kg (229 lb 15 oz)    Intake/Output: I/O last 3 completed shifts: In: 2490 [P.O.:840; I.V.:1650] Out: 1885 [Urine:1885]   Intake/Output this shift:     Physical Exam: General: Laying in bed, breathing room air, NAD  Head: Normocephalic, atraumatic. Moist oral mucosal membranes  Eyes: Anicteric, PERRL  Neck: Supple, trachea midline  Lungs:  Clear to auscultation  Heart: Bradycardia,+murmur  Abdomen:  Soft, nontender  Extremities: no peripheral edema. Gangrenous changes to several toe bilaterally  Neurologic: Pleasant, alert to self only  Skin: No lesions  GU Foley+    Basic Metabolic Panel:  Recent Labs Lab 12/30/15 0326 12/31/15 0840 01/01/16 0446 01/01/16 1607 01/02/16 0534  NA 139 137 130* 128* 134*  K 4.2 5.5* 6.3* 5.7* 5.4*  CL 101 97* 88* 89* 92*  CO2 33* 32 29 26 29   GLUCOSE 333* 162* 320* 286* 73  BUN 66* 88* 109* 123* 121*  CREATININE 1.83* 3.06* 4.10* 4.71* 4.76*  CALCIUM 8.5* 8.3* 8.1* 7.9* 7.7*  PHOS  --   --   --   --  6.3*    Liver Function Tests:  Recent Labs Lab 01/02/16 0534  ALBUMIN 2.9*   No results for input(s): LIPASE, AMYLASE in the last 168 hours. No results for input(s): AMMONIA in the last 168 hours.  CBC:  Recent Labs Lab 12/29/15 2240  12/30/15 0326 12/31/15 0840 01/01/16 0446  WBC 19.8* 20.3* 16.4* 14.1*  NEUTROABS 18.0*  --   --   --   HGB 10.8* 10.2* 10.1* 9.9*  HCT 33.4* 32.1* 31.4* 30.9*  MCV 90.5 89.9 90.6 91.7  PLT 232 191 179 189    Cardiac Enzymes:  Recent Labs Lab 12/29/15 2240 12/30/15 0326  TROPONINI 0.06* 0.09*    BNP: Invalid input(s): POCBNP  CBG:  Recent Labs Lab 01/02/16 1320 01/02/16 1605 01/02/16 1643 01/02/16 2124 01/03/16 0723  GLUCAP 107* 66 130* 184* 96    Microbiology: Results for orders placed or performed during the hospital encounter of 12/29/15  Blood culture (routine x 2)     Status: None (Preliminary result)   Collection Time: 12/29/15 11:05 PM  Result Value Ref Range Status   Specimen Description BLOOD BLOOD RIGHT FOREARM  Final   Special Requests BOTTLES DRAWN AEROBIC AND ANAEROBIC 6CC  Final   Culture NO GROWTH 3 DAYS  Final   Report Status PENDING  Incomplete  MRSA PCR Screening     Status: None   Collection Time: 12/30/15  1:52 AM  Result Value Ref Range Status   MRSA by PCR NEGATIVE NEGATIVE Final    Comment:        The GeneXpert MRSA Assay (FDA approved for NASAL  specimens only), is one component of a comprehensive MRSA colonization surveillance program. It is not intended to diagnose MRSA infection nor to guide or monitor treatment for MRSA infections.     Coagulation Studies: No results for input(s): LABPROT, INR in the last 72 hours.  Urinalysis:  Recent Labs  01/01/16 1100  COLORURINE YELLOW*  LABSPEC 1.019  PHURINE 5.0  GLUCOSEU 50*  HGBUR 3+*  BILIRUBINUR NEGATIVE  KETONESUR NEGATIVE  PROTEINUR NEGATIVE  NITRITE NEGATIVE  LEUKOCYTESUR TRACE*      Imaging: US Renal  01/02/2016  CLINICAL DATA:  Acute renal failure EXAM: RENAL / URINARY TRACT ULTRASOUND COMPLETE COMPARISON:  None. FINDINGS: Right Kidney: Length: 9.6 cm. Echogenicity within normal limits. No mass or hydronephrosis visualized. Probable linear small vascular  calcifications are noted in midpole. Left Kidney: Length: 10.9 cm. Echogenicity within normal limits. No mass or hydronephrosis visualized. Probable small vascular calcifications are noted Bladder: Urinary bladder is decompressed with Foley catheter. IMPRESSION: 1. No hydronephrosis. No focal renal mass. Probable bilateral renal small vascular calcifications. Decompressed urinary bladder with Foley catheter. Electronically Signed   By: Lahoma Crocker M.D.   On: 01/02/2016 10:44   Dg Chest Port 1 View  01/02/2016  CLINICAL DATA:  Shortness of breath and hypoxia EXAM: PORTABLE CHEST - 1 VIEW COMPARISON:  01/01/2016 FINDINGS: Cardiac shadow remains enlarged. A pacing device is again seen and stable. Patchy changes remain in the left lower lobe stable from the prior exam. No new focal infiltrate is seen. Mild stable vascular congestion is seen. IMPRESSION: No significant interval change from the prior exam. Electronically Signed   By: Inez Catalina M.D.   On: 01/02/2016 16:30     Medications:   . dextrose 5 % and 0.45% NaCl 50 mL/hr at 01/03/16 0600   . acidophilus  1 capsule Oral Daily  . amLODipine  10 mg Oral Daily  . antiseptic oral rinse  7 mL Mouth Rinse q12n4p  . aspirin EC  81 mg Oral Daily  . chlorhexidine  15 mL Mouth Rinse BID  . docusate sodium  100 mg Oral BID  . ferrous sulfate  325 mg Oral Daily  . gabapentin  100 mg Oral QHS  . heparin subcutaneous  5,000 Units Subcutaneous 3 times per day  . insulin aspart  0-20 Units Subcutaneous TID WC  . insulin aspart  0-5 Units Subcutaneous QHS  . sodium chloride  3 mL Intravenous Q12H  . sodium chloride  3 mL Intravenous Q12H   sodium chloride, acetaminophen **OR** acetaminophen, bisacodyl, ipratropium-albuterol, nitroGLYCERIN, ondansetron **OR** ondansetron (ZOFRAN) IV, polyethylene glycol, sodium chloride  Assessment/ Plan:  [name] is a 80 y.o. white female SNF resident with insulin dependent diabetes mellitus type II,  hypertension, atrial fibrillation, aortic stenosis, systolic congestive heart failure, dementia, peripheral arterial disease, seizure disorder, obstructive sleep apnea , who was admitted to [facility] on 12/29/2015 for Acute respiratory failure with hypoxia ([place]) [J96.01]   1. Acute renal failure with hyperkalemia on chronic kidney disease stage III: baseline creatinine of 2.  Now with hyperkalemia, elevated BUN, hyponatremia. Sodium and potassium are improving.  Acute renal failure from concurrent illness: overdiuresis with furosemide along with hypotension related to pneumonia and acute exacerbation of CHF. No potassium given. Allergic to ACE-I/ARB.  BUN can be explained by systemic steroids.  Hyperkalemia treated with kayexalate on 1/3.  Creatinine, Urine output, sodium, potassium all improving. BUN has not changed much.  Chronic kidney disease suspected to be due to diabetes and hypertension. Poor  candidate for long term chronic dialysis. Discussed case with family who agree on no dialysis.  - For now, continue NS, monitor closely due to congestive heart failure and hyponatremia.   2. Acute respiratory failure secondary to pneumonia and acute exacerbation of congestive heart failure. Weaned to room air from BiPap Now getting NS due to concern of overdiuresis.  - appreciate pulmonary input. Supportive measures, oxygen, and antibiotics.   3. Diabetes mellitus type II with chronic kidney disease: insulin dependent: poorly controlled during this admission.   4. Hypertension: blood pressure has been at goal. Home regimen of furosemide, amlodipine - Currently not on any agents.    LOS: [place], [place] 1/5/20179:29 AM

## 2016-01-03 NOTE — Progress Notes (Signed)
   01/03/16 1300  Clinical Encounter Type  Visited With Patient  Visit Type Initial  Referral From Nurse  Consult/Referral To Chaplain  Spiritual Encounters  Spiritual Needs Other (Comment)  Stress Factors  Patient Stress Factors None identified  Chaplain rounded in the unit and offered a compassionate presence and introduction. The patient was friendly and appeared very positive and hopeful. No need was identified at this time. Chaplain Jeralyn Nolden A. Wilborn Membreno Ext. [phone]

## 2016-01-03 NOTE — Progress Notes (Signed)
Placed pt on Bipap for sleep

## 2016-01-03 NOTE — Progress Notes (Signed)
HPOA and guardianship paper work are on chart. Plan is for patient to return to [facility] when medically stable. Clinical Social Worker (CSW) will continue to follow and assist as needed.   Blima Rich, [place] [phone]

## 2016-01-03 NOTE — Progress Notes (Signed)
[place] at [place] NAME: Ayana Downs    MR#:  [id number]  DATE OF BIRTH:  1936/10/25  SUBJECTIVE:  CHIEF COMPLAINT:   Chief Complaint  Patient presents with  . Respiratory Distress   Alert. Oriented. Off of BiPAP at this time and in no respiratory distress with great oxygen saturation in air movement.  REVIEW OF SYSTEMS:  Review of Systems  Constitutional: Negative for fever and chills.  Respiratory: Negative for cough, shortness of breath and wheezing.   Cardiovascular: Negative for chest pain and palpitations.  Gastrointestinal: Negative for nausea, vomiting, abdominal pain, diarrhea and constipation.  Genitourinary: Negative for dysuria.       Oliguric  Musculoskeletal: Positive for myalgias.  Neurological: Positive for weakness. Negative for dizziness, seizures and headaches.    DRUG ALLERGIES:   Allergies  Allergen Reactions  . Ace Inhibitors   . Dristan Cold [Chlorphen-Pe-Acetaminophen]   . Iodine   . Keppra [Levetiracetam]   . Latex   . Other     ARB-angiotension receptor antsgonist All topical AG treatment  . Silver     silverdeen cream  . Statins     VITALS:  Blood pressure 150/63, pulse 60, temperature 97.4 F (36.3 C), temperature source Oral, resp. rate 18, height 5\' 5"  (1.651 m), weight 104.3 kg (229 lb 15 oz), SpO2 99 %.  PHYSICAL EXAMINATION:  Physical Exam  GENERAL:  80 y.o.-year-old patient lying in the bed with no acute distress. On nasal cannula EYES: Pupils equal, round, reactive to light and accommodation. No scleral icterus. Extraocular muscles intact.  HEENT: Head atraumatic, normocephalic. Oropharynx and nasopharynx clear.  NECK:  Supple, no jugular venous distention. No thyroid enlargement, no tenderness.  LUNGS: Normal breath sounds bilaterally, no wheezing, rales,rhonchi or crepitation. No use of accessory muscles of respiration. Decreased bibasilar breath sounds CARDIOVASCULAR:  S1, S2 normal. No murmurs, rubs, or gallops.  ABDOMEN: Soft, nontender, nondistended. Bowel sounds present. No organomegaly or mass.  EXTREMITIES: No pedal edema, cyanosis, or clubbing.  NEUROLOGIC: Cranial nerves II through XII are intact. Muscle strength 5/5 in all extremities. Sensation intact. Gait not checked.  PSYCHIATRIC: The patient is alert and oriented SKIN: No obvious rash, lesion, or ulcer.    LABORATORY PANEL:   CBC  Recent Labs Lab 01/01/16 0446  WBC 14.1*  HGB 9.9*  HCT 30.9*  PLT 189   ------------------------------------------------------------------------------------------------------------------  Chemistries   Recent Labs Lab 01/03/16 0640  NA 133*  K 4.0  CL 94*  CO2 25  GLUCOSE 97  BUN 127*  CREATININE 4.53*  CALCIUM 7.2*   ------------------------------------------------------------------------------------------------------------------  Cardiac Enzymes  Recent Labs Lab 12/30/15 0326  TROPONINI 0.09*   ------------------------------------------------------------------------------------------------------------------  RADIOLOGY:  US Renal  01/02/2016  CLINICAL DATA:  Acute renal failure EXAM: RENAL / URINARY TRACT ULTRASOUND COMPLETE COMPARISON:  None. FINDINGS: Right Kidney: Length: 9.6 cm. Echogenicity within normal limits. No mass or hydronephrosis visualized. Probable linear small vascular calcifications are noted in midpole. Left Kidney: Length: 10.9 cm. Echogenicity within normal limits. No mass or hydronephrosis visualized. Probable small vascular calcifications are noted Bladder: Urinary bladder is decompressed with Foley catheter. IMPRESSION: 1. No hydronephrosis. No focal renal mass. Probable bilateral renal small vascular calcifications. Decompressed urinary bladder with Foley catheter. Electronically Signed   By: Lahoma Crocker M.D.   On: 01/02/2016 10:44   Dg Chest Port 1 View  01/02/2016  CLINICAL DATA:  Shortness of breath and hypoxia  EXAM: PORTABLE CHEST - 1 VIEW COMPARISON:  01/01/2016 FINDINGS: Cardiac shadow remains enlarged. A pacing device is again seen and stable. Patchy changes remain in the left lower lobe stable from the prior exam. No new focal infiltrate is seen. Mild stable vascular congestion is seen. IMPRESSION: No significant interval change from the prior exam. Electronically Signed   By: Inez Catalina M.D.   On: 01/02/2016 16:30    EKG:   Orders placed or performed during the hospital encounter of 12/29/15  . ED EKG  . ED EKG  . EKG 12-Lead  . EKG 12-Lead    ASSESSMENT AND PLAN:     1. Acute on chronic systolic congestive heart failure with acute pulmonary edema, resolved, discontinue diuresis with Lasix due to renal failure. Repeat chest x-ray with improvement. -Echocardiogram with EF of 55%, improved when compared to prior echo. LVH noted and possibly diastolic dysfunction.  2. Bacterial pneumonia, healthcare associated, was on broad-spectrum antibiotics. Blood cultures have remained negative. -Improving left pleural effusion. Only atelectasis on chest x-ray. Antibiotics have been discontinued. -Encouraged incentive spirometry.  -3. Elevated troponin, likely demand ischemia. Echocardiogram in May 2016 revealed LVH, ejection fraction of 45-50%, aortic stenosis, elevated central venous in the right atrial pressures.   4 . Acute on chronic Renal failure- worsened with diuresis. -Appreciate nephrology consult. Family refused any dialysis. -Potassium normal -Gentle hydration and hold Lasix. -Renal ultrasound with no hydronephrosis.. -Foley catheter for strict input and output monitoring.   5. diabetes mellitus - Blood sugars have been labile, continue sliding scale  6. Hypertension-on Norvasc  7. DVT prophylaxis-on subcutaneous heparin  8. Chronic respiratory failure: Patient has been on BiPAP continuously since her admission at [facility]. She is doing very well off of BiPAP here. Pulmonology has  been following. Dr. Jamal Collin has discussed BiPAP with her daughter today. The family is in favor of continuous BiPAP. We will try a trial of interval BiPAP with 3 hours off and one hour on during the day and BiPAP at bedtime.   Okay to transfer to telemetry  All the records are reviewed and case discussed with Care Management/Social Workerr. Management plans discussed with the patient, family and they are in agreement.  CODE STATUS: Full code  TOTAL CRITICAL CARE TIME SPENT IN TAKING CARE OF THIS PATIENT: 64minutes.   POSSIBLE D/C IN 3DAYS, DEPENDING ON CLINICAL CONDITION.   Myrtis Ser M.D on 01/03/2016 at 5:40 PM  Between 7am to 6pm - Pager - [phone] After 6pm go to [email or web address] - password [email or web address] [facility]  Office  [phone]  CC: Primary care physician; Juluis Pitch, MD

## 2016-01-03 NOTE — Progress Notes (Signed)
Inpatient Diabetes Program Recommendations  AACE/ADA: New Consensus Statement on Inpatient Glycemic Control (2015)  Target Ranges:  Prepandial:   less than 140 mg/dL      [facility] postprandial:   less than 180 mg/dL (1-2 hours)      Critically ill patients:  140 - 180 mg/dL   Review of Glycemic Control  Results for HILDUR, PANCOAST (MRN [id number]) as of 01/03/2016 14:26  Ref. Range 01/02/2016 16:05 01/02/2016 16:43 01/02/2016 21:24 01/03/2016 07:23 01/03/2016 11:12  Glucose-Capillary Latest Ref Range: 65-99 mg/dL 66 130 (H) 184 (H) 96 215 (H)    Current orders for Inpatient glycemic control:  Novolog 0-20 units TID with meals, Novolog 0-5 units HS  Inpatient Diabetes Program Recommendations: Levemir 20 units QHS, Levemir 28 units QAM were ordered on initial admission to the hospital but 20 units of Levemir last given at 22:06 on 01/01/16 because the patient had low blood sugars on 01/02/16 and ALL Levemir was discontinued at that time.   Please consider adding back some Levemir- consider Levemir 10 units qhs.  Gentry Fitz, RN, BA, MHA, CDE Diabetes Coordinator Inpatient Diabetes Program  [phone] (Team Pager) [phone] ([place]) 01/03/2016 2:37 PM

## 2016-01-04 LAB — CBC
HEMATOCRIT: 31.2 % — AB (ref 35.0–47.0)
HEMOGLOBIN: 10.3 g/dL — AB (ref 12.0–16.0)
MCH: 29 pg (ref 26.0–34.0)
MCHC: 33 g/dL (ref 32.0–36.0)
MCV: 87.9 fL (ref 80.0–100.0)
Platelets: 160 10*3/uL (ref 150–440)
RBC: 3.55 MIL/uL — ABNORMAL LOW (ref 3.80–5.20)
RDW: 18.5 % — ABNORMAL HIGH (ref 11.5–14.5)
WBC: 7.7 10*3/uL (ref 3.6–11.0)

## 2016-01-04 LAB — RENAL FUNCTION PANEL
ALBUMIN: 2.7 g/dL — AB (ref 3.5–5.0)
Anion gap: 11 (ref 5–15)
BUN: 112 mg/dL — AB (ref 6–20)
CALCIUM: 7.1 mg/dL — AB (ref 8.9–10.3)
CO2: 28 mmol/L (ref 22–32)
CREATININE: 3.89 mg/dL — AB (ref 0.44–1.00)
Chloride: 92 mmol/L — ABNORMAL LOW (ref 101–111)
GFR calc Af Amer: 12 mL/min — ABNORMAL LOW (ref >60)
GFR, EST NON AFRICAN AMERICAN: 10 mL/min — AB (ref >60)
GLUCOSE: 184 mg/dL — AB (ref 65–99)
PHOSPHORUS: 6.8 mg/dL — AB (ref 2.5–4.6)
POTASSIUM: 3.9 mmol/L (ref 3.5–5.1)
SODIUM: 131 mmol/L — AB (ref 135–145)

## 2016-01-04 LAB — GLUCOSE, CAPILLARY
GLUCOSE-CAPILLARY: 148 mg/dL — AB (ref 65–99)
GLUCOSE-CAPILLARY: 169 mg/dL — AB (ref 65–99)
GLUCOSE-CAPILLARY: 212 mg/dL — AB (ref 65–99)
Glucose-Capillary: 177 mg/dL — ABNORMAL HIGH (ref 65–99)

## 2016-01-04 NOTE — Progress Notes (Signed)
Inpatient Diabetes Program Recommendations  AACE/ADA: New Consensus Statement on Inpatient Glycemic Control (2015)  Target Ranges:  Prepandial:   less than 140 mg/dL      [facility] postprandial:   less than 180 mg/dL (1-2 hours)      Critically ill patients:  140 - 180 mg/dL   Review of Glycemic Control Results for Kylie Martinez, Kylie Martinez (MRN [id number]) as of 01/04/2016 08:00  Ref. Range 01/03/2016 07:23 01/03/2016 11:12 01/03/2016 16:06 01/03/2016 21:31 01/04/2016 07:51  Glucose-Capillary Latest Ref Range: 65-99 mg/dL 96 No insulin indicated 215 (H) Novolog not given 289 (H) 11 units Novolog 171 (H) No insulin indicated 177 (H)    Current orders for Inpatient glycemic control: Novolog 0-20 units TID with meals, Novolog 0-5 units HS  Inpatient Diabetes Program Recommendations: Levemir 20 units QHS, Levemir 28 units QAM were ordered on initial admission to the hospital but 20 units of Levemir last given at 22:06 on 01/01/16 because the patient had low blood sugars on 01/02/16 and ALL Levemir was discontinued at that time.   Please consider adding back some Levemir- consider Levemir 10 units qhs- fasting blood sugar 177mg /dl  Staff- please ensure this patient receives her Novolog insulin as indicated.   Gentry Fitz, RN, BA, MHA, CDE Diabetes Coordinator Inpatient Diabetes Program  [phone] (Team Pager) [phone] ([place]) 01/04/2016 8:02 AM

## 2016-01-04 NOTE — Progress Notes (Signed)
Pt is now off the Bipap and stable at this time. Toes treated according to care note. Pt up in chair.

## 2016-01-04 NOTE — Progress Notes (Signed)
   01/04/16 1100  Clinical Encounter Type  Visited With Patient  Visit Type Follow-up  Consult/Referral To Chaplain  Spiritual Encounters  Spiritual Needs Emotional  Stress Factors  Patient Stress Factors Loss of control  Chaplain rounded in the unit and offered a compassionate presence as I listened to the patient's concerns and challenges. Chaplain Journie Howson A. Kalim Kissel Ext. [phone]

## 2016-01-04 NOTE — Progress Notes (Signed)
[place] at [place] NAME: Kylie Martinez    MR#:  [id number]  DATE OF BIRTH:  May 11, 1936  SUBJECTIVE:  CHIEF COMPLAINT:   Chief Complaint  Patient presents with  . Respiratory Distress   On BiPAP at this time. Alert. No specific complaints.  REVIEW OF SYSTEMS:  Review of Systems  Constitutional: Negative for fever and chills.  Respiratory: Negative for cough, shortness of breath and wheezing.   Cardiovascular: Negative for chest pain and palpitations.  Gastrointestinal: Negative for nausea, vomiting, abdominal pain, diarrhea and constipation.  Genitourinary: Negative for dysuria.       Oliguric  Musculoskeletal: Positive for myalgias.  Neurological: Positive for weakness. Negative for dizziness, seizures and headaches.    DRUG ALLERGIES:   Allergies  Allergen Reactions  . Ace Inhibitors   . Dristan Cold [Chlorphen-Pe-Acetaminophen]   . Iodine   . Keppra [Levetiracetam]   . Latex   . Other     ARB-angiotension receptor antsgonist All topical AG treatment  . Silver     silverdeen cream  . Statins     VITALS:  Blood pressure 150/77, pulse 60, temperature 97.7 F (36.5 C), temperature source Oral, resp. rate 19, height 5\' 5"  (1.651 m), weight 101.1 kg (222 lb 14.2 oz), SpO2 96 %.  PHYSICAL EXAMINATION:  Physical Exam  GENERAL:  80 y.o.-year-old patient sitting up in chair, BiPAP mask on, no distress EYES: Pupils equal, round, reactive to light and accommodation. No scleral icterus. Extraocular muscles intact.  HEENT: Head atraumatic, normocephalic. Oropharynx and nasopharynx clear.  NECK:  Supple, no jugular venous distention. No thyroid enlargement, no tenderness.  LUNGS: Normal breath sounds bilaterally, no wheezing, rales,rhonchi or crepitation. No use of accessory muscles of respiration. Decreased bibasilar breath sounds CARDIOVASCULAR: S1, S2 normal. No murmurs, rubs, or gallops.  ABDOMEN: Soft, nontender,  nondistended. Bowel sounds present. No organomegaly or mass.  EXTREMITIES: No pedal edema, cyanosis, or clubbing.  NEUROLOGIC: Cranial nerves II through XII are intact. Muscle strength 5/5 in all extremities. Sensation intact. Gait not checked.  PSYCHIATRIC: The patient is alert and calm SKIN: No obvious rash, lesion, or ulcer.    LABORATORY PANEL:   CBC  Recent Labs Lab 01/04/16 0559  WBC 7.7  HGB 10.3*  HCT 31.2*  PLT 160   ------------------------------------------------------------------------------------------------------------------  Chemistries   Recent Labs Lab 01/04/16 0107  NA 131*  K 3.9  CL 92*  CO2 28  GLUCOSE 184*  BUN 112*  CREATININE 3.89*  CALCIUM 7.1*   ------------------------------------------------------------------------------------------------------------------  Cardiac Enzymes  Recent Labs Lab 12/30/15 0326  TROPONINI 0.09*   ------------------------------------------------------------------------------------------------------------------  RADIOLOGY:  No results found.  EKG:   Orders placed or performed during the hospital encounter of 12/29/15  . ED EKG  . ED EKG  . EKG 12-Lead  . EKG 12-Lead    ASSESSMENT AND PLAN:     1. Acute on chronic systolic congestive heart failure with acute pulmonary edema, resolved, discontinue diuresis with Lasix due to renal failure. Repeat chest x-ray with improvement. -Echocardiogram with EF of 55%, improved when compared to prior echo. LVH noted and possibly diastolic dysfunction.  2. Bacterial pneumonia, healthcare associated - Clinically improved. No leukocytosis or fever. Antibiotics discontinued course completed  -3. Elevated troponin, likely demand ischemia. No chest pain, EKG change or events on telemetry  4 . Acute on chronic Renal failure- worsened with diuresis. -Appreciate nephrology consult. Family refused any dialysis. -Potassium normal -Gentle hydration and hold  Lasix. -Renal  ultrasound with no hydronephrosis.. -Foley catheter for strict input and output monitoring. Good urine output  5. diabetes mellitus - Blood sugars improved now that steroids are stopped. Continue sliding scale   6. Hypertension-controlled on Norvasc  7. DVT prophylaxis-on subcutaneous heparin  8. Chronic respiratory failure: Patient has been on BiPAP continuously since her admission at [facility] one month ago. She is doing very well off of BiPAP here. Pulmonology has been following. Dr. Jamal Collin has discussed BiPAP with her daughter on 1/5. The family is in favor of continuous BiPAP as prescribed at [facility] neurology. We will try a trial of interval BiPAP with 3 hours off and one hour on during the day and BiPAP at bedtime. So far for the past 24 hours she has been doing very well with this   Okay to transfer to telemetry  All the records are reviewed and case discussed with Care Management/Social Workerr. Management plans discussed with the patient, family and they are in agreement.  CODE STATUS: Full code  TOTAL CRITICAL CARE TIME SPENT IN TAKING CARE OF THIS PATIENT: 5minutes.   POSSIBLE D/C IN 2  DAYS, DEPENDING ON CLINICAL CONDITION.   Myrtis Ser M.D on 01/04/2016 at 4:45 PM  Between 7am to 6pm - Pager - [phone] After 6pm go to [email or web address] - password [email or web address] [place] [facility]  Office  [phone]  CC: Primary care physician; Juluis Pitch, MD

## 2016-01-04 NOTE — Progress Notes (Signed)
Respiratory called to apply bi-pap  For HS.

## 2016-01-04 NOTE — Progress Notes (Signed)
Per [facility] liaison patient can return to [facility] when medically stable.   Blima Rich, [place] [phone]

## 2016-01-04 NOTE — Progress Notes (Signed)
[facility]  ROUNDING NOTE   Subjective:   Laying in bed comfortably.  UOP 2475 Creatinine 3.89 (4.53) D5 1/2NS at 61mL/hr  Objective:  Vital signs in last 24 hours:  Temp:  [97.6 F (36.4 C)-97.8 F (36.6 C)] 97.7 F (36.5 C) (01/06 0800) Pulse Rate:  [55-119] 119 (01/06 0800) Resp:  [16-26] 18 (01/06 0800) BP: (128-159)/(57-110) 147/62 mmHg (01/06 0800) SpO2:  [90 %-99 %] 95 % (01/06 0800) FiO2 (%):  [21 %] 21 % (01/06 0400) Weight:  [101.1 kg (222 lb 14.2 oz)] 101.1 kg (222 lb 14.2 oz) (01/06 0500)  Weight change: -3.2 kg (-7 lb 0.9 oz) Filed Weights   01/02/16 0500 01/03/16 0515 01/04/16 0500  Weight: 102.8 kg (226 lb 10.1 oz) 104.3 kg (229 lb 15 oz) 101.1 kg (222 lb 14.2 oz)    Intake/Output: I/O last 3 completed shifts: In: 2376 [P.O.:720; I.V.:1656] Out: [id number] [Urine:3325]   Intake/Output this shift:  Total I/O In: -  Out: 300 [Urine:300]  Physical Exam: General: Laying in bed, breathing room air, NAD  Head: Normocephalic, atraumatic. Moist oral mucosal membranes  Eyes: Anicteric, PERRL  Neck: Supple, trachea midline  Lungs:  Clear to auscultation, 2 L [place]  Heart: Bradycardia,+murmur  Abdomen:  Soft, nontender  Extremities: no peripheral edema. Gangrenous changes to several toe bilaterally  Neurologic: Pleasant, alert to self only  Skin: No lesions  GU Foley+    Basic Metabolic Panel:  Recent Labs Lab 01/01/16 0446 01/01/16 1607 01/02/16 0534 01/03/16 0640 01/04/16 0107  NA 130* 128* 134* 133* 131*  K 6.3* 5.7* 5.4* 4.0 3.9  CL 88* 89* 92* 94* 92*  CO2 29 26 29 25 28   GLUCOSE 320* 286* 73 97 184*  BUN 109* 123* 121* 127* 112*  CREATININE 4.10* 4.71* 4.76* 4.53* 3.89*  CALCIUM 8.1* 7.9* 7.7* 7.2* 7.1*  PHOS  --   --  6.3* 6.9* 6.8*    Liver Function Tests:  Recent Labs Lab 01/02/16 0534 01/03/16 0640 01/04/16 0107  ALBUMIN 2.9* 2.8* 2.7*   No results for input(s): LIPASE, AMYLASE in the last 168 hours. No results for  input(s): AMMONIA in the last 168 hours.  CBC:  Recent Labs Lab 12/29/15 2240 12/30/15 0326 12/31/15 0840 01/01/16 0446 01/04/16 0559  WBC 19.8* 20.3* 16.4* 14.1* 7.7  NEUTROABS 18.0*  --   --   --   --   HGB 10.8* 10.2* 10.1* 9.9* 10.3*  HCT 33.4* 32.1* 31.4* 30.9* 31.2*  MCV 90.5 89.9 90.6 91.7 87.9  PLT 232 191 179 189 160    Cardiac Enzymes:  Recent Labs Lab 12/29/15 2240 12/30/15 0326  TROPONINI 0.06* 0.09*    BNP: Invalid input(s): POCBNP  CBG:  Recent Labs Lab 01/03/16 0723 01/03/16 1112 01/03/16 1606 01/03/16 2131 01/04/16 0751  GLUCAP 96 215* 289* 171* 177*    Microbiology: Results for orders placed or performed during the hospital encounter of 12/29/15  Blood culture (routine x 2)     Status: None   Collection Time: 12/29/15 11:05 PM  Result Value Ref Range Status   Specimen Description BLOOD BLOOD RIGHT FOREARM  Final   Special Requests BOTTLES DRAWN AEROBIC AND ANAEROBIC [place]  Final   Culture NO GROWTH 5 DAYS  Final   Report Status 01/03/2016 FINAL  Final  MRSA PCR Screening     Status: None   Collection Time: 12/30/15  1:52 AM  Result Value Ref Range Status   MRSA by PCR NEGATIVE NEGATIVE Final  Comment:        The GeneXpert MRSA Assay (FDA approved for NASAL specimens only), is one component of a comprehensive MRSA colonization surveillance program. It is not intended to diagnose MRSA infection nor to guide or monitor treatment for MRSA infections.     Coagulation Studies: No results for input(s): LABPROT, INR in the last 72 hours.  Urinalysis:  Recent Labs  01/01/16 1100  COLORURINE YELLOW*  LABSPEC 1.019  PHURINE 5.0  GLUCOSEU 50*  HGBUR 3+*  BILIRUBINUR NEGATIVE  KETONESUR NEGATIVE  PROTEINUR NEGATIVE  NITRITE NEGATIVE  LEUKOCYTESUR TRACE*      Imaging: US Renal  01/02/2016  CLINICAL DATA:  Acute renal failure EXAM: RENAL / URINARY TRACT ULTRASOUND COMPLETE COMPARISON:  None. FINDINGS: Right Kidney: Length:  9.6 cm. Echogenicity within normal limits. No mass or hydronephrosis visualized. Probable linear small vascular calcifications are noted in midpole. Left Kidney: Length: 10.9 cm. Echogenicity within normal limits. No mass or hydronephrosis visualized. Probable small vascular calcifications are noted Bladder: Urinary bladder is decompressed with Foley catheter. IMPRESSION: 1. No hydronephrosis. No focal renal mass. Probable bilateral renal small vascular calcifications. Decompressed urinary bladder with Foley catheter. Electronically Signed   By: Lahoma Crocker M.D.   On: 01/02/2016 10:44   Dg Chest Port 1 View  01/02/2016  CLINICAL DATA:  Shortness of breath and hypoxia EXAM: PORTABLE CHEST - 1 VIEW COMPARISON:  01/01/2016 FINDINGS: Cardiac shadow remains enlarged. A pacing device is again seen and stable. Patchy changes remain in the left lower lobe stable from the prior exam. No new focal infiltrate is seen. Mild stable vascular congestion is seen. IMPRESSION: No significant interval change from the prior exam. Electronically Signed   By: Inez Catalina M.D.   On: 01/02/2016 16:30     Medications:   . dextrose 5 % and 0.45% NaCl 50 mL/hr at 01/03/16 1900   . acidophilus  1 capsule Oral Daily  . amLODipine  10 mg Oral Daily  . antiseptic oral rinse  7 mL Mouth Rinse q12n4p  . aspirin EC  81 mg Oral Daily  . chlorhexidine  15 mL Mouth Rinse BID  . docusate sodium  100 mg Oral BID  . ferrous sulfate  325 mg Oral Daily  . gabapentin  100 mg Oral QHS  . heparin subcutaneous  5,000 Units Subcutaneous 3 times per day  . insulin aspart  0-20 Units Subcutaneous TID WC  . insulin aspart  0-5 Units Subcutaneous QHS  . sodium chloride  3 mL Intravenous Q12H  . sodium chloride  3 mL Intravenous Q12H   sodium chloride, acetaminophen **OR** acetaminophen, bisacodyl, ipratropium-albuterol, nitroGLYCERIN, ondansetron **OR** ondansetron (ZOFRAN) IV, polyethylene glycol, sodium chloride  Assessment/ Plan:  Ms.  Kylie Martinez is a 80 y.o. white female SNF resident with insulin dependent diabetes mellitus type II, hypertension, atrial fibrillation, aortic stenosis, systolic congestive heart failure, dementia, peripheral arterial disease, seizure disorder, obstructive sleep apnea , who was admitted to [facility] on 12/29/2015 for Acute respiratory failure with hypoxia ([place]) [J96.01]   1. Acute renal failure with hyperkalemia, hyponatremia on chronic kidney disease stage III: baseline creatinine of 2.  Now with hyperkalemia, elevated BUN, hyponatremia. Creatinine, BUN, sodium and potassium are improving.  Acute renal failure from concurrent illness: overdiuresis with furosemide along with hypotension related to pneumonia and acute exacerbation of CHF.  Allergic to ACE-I/ARB.  Elevated BUN can be explained by systemic steroids.  Hyperkalemia treated with kayexalate on 1/3.  Chronic kidney disease suspected to be due  to diabetes and hypertension. Poor candidate for long term chronic dialysis. Discussed case with family who agree on no dialysis.  - For now, continue IV Fluids, monitor closely due to congestive heart failure and hyponatremia.   2. Acute respiratory failure secondary to pneumonia and acute exacerbation of congestive heart failure.  Now getting IV fluids due to concern of overdiuresis.  - appreciate pulmonary input. Supportive measures, oxygen, and antibiotics.   3. Diabetes mellitus type II with chronic kidney disease: insulin dependent: poorly controlled during this admission.   4. Hypertension: blood pressure has been at goal. Home regimen of furosemide, amlodipine - Currently not on any agents.    LOS: 6 [place] 1/6/20179:11 AM

## 2016-01-05 LAB — GLUCOSE, CAPILLARY
GLUCOSE-CAPILLARY: 155 mg/dL — AB (ref 65–99)
GLUCOSE-CAPILLARY: 174 mg/dL — AB (ref 65–99)
Glucose-Capillary: 187 mg/dL — ABNORMAL HIGH (ref 65–99)
Glucose-Capillary: 153 mg/dL — ABNORMAL HIGH (ref 65–99)

## 2016-01-05 LAB — CBC
HEMATOCRIT: 32.7 % — AB (ref 35.0–47.0)
HEMOGLOBIN: 10.9 g/dL — AB (ref 12.0–16.0)
MCH: 29.8 pg (ref 26.0–34.0)
MCHC: 33.3 g/dL (ref 32.0–36.0)
MCV: 89.5 fL (ref 80.0–100.0)
Platelets: 158 10*3/uL (ref 150–440)
RBC: 3.65 MIL/uL — ABNORMAL LOW (ref 3.80–5.20)
RDW: 18 % — AB (ref 11.5–14.5)
WBC: 7.6 10*3/uL (ref 3.6–11.0)

## 2016-01-05 LAB — BASIC METABOLIC PANEL
ANION GAP: 12 (ref 5–15)
BUN: 110 mg/dL — ABNORMAL HIGH (ref 6–20)
CHLORIDE: 96 mmol/L — AB (ref 101–111)
CO2: 26 mmol/L (ref 22–32)
CREATININE: 3.35 mg/dL — AB (ref 0.44–1.00)
Calcium: 7.1 mg/dL — ABNORMAL LOW (ref 8.9–10.3)
GFR calc non Af Amer: 12 mL/min — ABNORMAL LOW (ref >60)
GFR, EST AFRICAN AMERICAN: 14 mL/min — AB (ref >60)
GLUCOSE: 197 mg/dL — AB (ref 65–99)
Potassium: 3.9 mmol/L (ref 3.5–5.1)
Sodium: 134 mmol/L — ABNORMAL LOW (ref 135–145)

## 2016-01-05 NOTE — Progress Notes (Signed)
[facility]  ROUNDING NOTE   Subjective:   Laying in bed comfortably. BiPap at night UOP 1900 (2475) Creatinine 3.35 (3.89) (4.53) D5 1/2NS at 42mL/hr  Objective:  Vital signs in last 24 hours:  Temp:  [97.5 F (36.4 C)-98.4 F (36.9 C)] 98.2 F (36.8 C) (01/07 1140) Pulse Rate:  [59-97] 59 (01/07 1140) Resp:  [14-21] 18 (01/07 1140) BP: (114-163)/(47-74) 137/47 mmHg (01/07 1140) SpO2:  [88 %-95 %] 95 % (01/07 1140) Weight:  [105.235 kg (232 lb)] 105.235 kg (232 lb) (01/07 0500)  Weight change: 4.135 kg (9 lb 1.8 oz) Filed Weights   01/03/16 0515 01/04/16 0500 01/05/16 0500  Weight: 104.3 kg (229 lb 15 oz) 101.1 kg (222 lb 14.2 oz) 105.235 kg (232 lb)    Intake/Output: I/O last 3 completed shifts: In: 2322.7 [P.O.:600; I.V.:1722.7] Out: 3275 [Urine:3275]   Intake/Output this shift:  Total I/O In: 270 [P.O.:270] Out: 1650 [Urine:1650]  Physical Exam: General: Laying in bed, breathing room air, NAD  Head: Normocephalic, atraumatic. Moist oral mucosal membranes  Eyes: Anicteric, PERRL  Neck: Supple, trachea midline  Lungs:  Clear to auscultation, BiPap  Heart: Bradycardia,+murmur  Abdomen:  Soft, nontender  Extremities: no peripheral edema. Gangrenous changes to several toes bilaterally  Neurologic: Pleasant, alert to self only  Skin: No lesions  GU Foley+    Basic Metabolic Panel:  Recent Labs Lab 01/01/16 1607 01/02/16 0534 01/03/16 0640 01/04/16 0107 01/05/16 0537  NA 128* 134* 133* 131* 134*  K 5.7* 5.4* 4.0 3.9 3.9  CL 89* 92* 94* 92* 96*  CO2 26 29 25 28 26   GLUCOSE 286* 73 97 184* 197*  BUN 123* 121* 127* 112* 110*  CREATININE 4.71* 4.76* 4.53* 3.89* 3.35*  CALCIUM 7.9* 7.7* 7.2* 7.1* 7.1*  PHOS  --  6.3* 6.9* 6.8*  --     Liver Function Tests:  Recent Labs Lab 01/02/16 0534 01/03/16 0640 01/04/16 0107  ALBUMIN 2.9* 2.8* 2.7*   No results for input(s): LIPASE, AMYLASE in the last 168 hours. No results for input(s):  AMMONIA in the last 168 hours.  CBC:  Recent Labs Lab 12/29/15 2240 12/30/15 0326 12/31/15 0840 01/01/16 0446 01/04/16 0559 01/05/16 0537  WBC 19.8* 20.3* 16.4* 14.1* 7.7 7.6  NEUTROABS 18.0*  --   --   --   --   --   HGB 10.8* 10.2* 10.1* 9.9* 10.3* 10.9*  HCT 33.4* 32.1* 31.4* 30.9* 31.2* 32.7*  MCV 90.5 89.9 90.6 91.7 87.9 89.5  PLT 232 191 179 189 160 158    Cardiac Enzymes:  Recent Labs Lab 12/29/15 2240 12/30/15 0326  TROPONINI 0.06* 0.09*    BNP: Invalid input(s): POCBNP  CBG:  Recent Labs Lab 01/04/16 1114 01/04/16 1539 01/04/16 2125 01/05/16 0748 01/05/16 1147  GLUCAP 148* 169* 212* 187* 155*    Microbiology: Results for orders placed or performed during the hospital encounter of 12/29/15  Blood culture (routine x 2)     Status: None   Collection Time: 12/29/15 11:05 PM  Result Value Ref Range Status   Specimen Description BLOOD BLOOD RIGHT FOREARM  Final   Special Requests BOTTLES DRAWN AEROBIC AND ANAEROBIC [place]  Final   Culture NO GROWTH 5 DAYS  Final   Report Status 01/03/2016 FINAL  Final  MRSA PCR Screening     Status: None   Collection Time: 12/30/15  1:52 AM  Result Value Ref Range Status   MRSA by PCR NEGATIVE NEGATIVE Final    Comment:  The GeneXpert MRSA Assay (FDA approved for NASAL specimens only), is one component of a comprehensive MRSA colonization surveillance program. It is not intended to diagnose MRSA infection nor to guide or monitor treatment for MRSA infections.     Coagulation Studies: No results for input(s): LABPROT, INR in the last 72 hours.  Urinalysis: No results for input(s): COLORURINE, LABSPEC, PHURINE, GLUCOSEU, HGBUR, BILIRUBINUR, KETONESUR, PROTEINUR, UROBILINOGEN, NITRITE, LEUKOCYTESUR in the last 72 hours.  Invalid input(s): APPERANCEUR    Imaging: No results found.   Medications:   . dextrose 5 % and 0.45% NaCl 50 mL/hr at 01/04/16 2218   . acidophilus  1 capsule Oral Daily  .  amLODipine  10 mg Oral Daily  . antiseptic oral rinse  7 mL Mouth Rinse q12n4p  . aspirin EC  81 mg Oral Daily  . chlorhexidine  15 mL Mouth Rinse BID  . docusate sodium  100 mg Oral BID  . ferrous sulfate  325 mg Oral Daily  . gabapentin  100 mg Oral QHS  . heparin subcutaneous  5,000 Units Subcutaneous 3 times per day  . insulin aspart  0-20 Units Subcutaneous TID WC  . insulin aspart  0-5 Units Subcutaneous QHS  . sodium chloride  3 mL Intravenous Q12H  . sodium chloride  3 mL Intravenous Q12H   sodium chloride, acetaminophen **OR** acetaminophen, bisacodyl, ipratropium-albuterol, nitroGLYCERIN, ondansetron **OR** ondansetron (ZOFRAN) IV, polyethylene glycol, sodium chloride  Assessment/ Plan:  [name] is a 80 y.o. white female SNF resident with insulin dependent diabetes mellitus type II, hypertension, atrial fibrillation, aortic stenosis, systolic congestive heart failure, dementia, peripheral arterial disease, seizure disorder, obstructive sleep apnea , who was admitted to [facility] on 12/29/2015 for Acute respiratory failure with hypoxia ([place]) [J96.01]   1. Acute renal failure with hyperkalemia, hyponatremia on chronic kidney disease stage III: baseline creatinine of 2.  Now with hyperkalemia, elevated BUN, hyponatremia. Creatinine, BUN, sodium and potassium are improving.  Acute renal failure from concurrent illness: overdiuresis with furosemide along with hypotension related to pneumonia and acute exacerbation of CHF.  Allergic to ACE-I/ARB.  Elevated BUN can be explained by systemic steroids.  Hyperkalemia treated with kayexalate on 1/3.  Chronic kidney disease suspected to be due to diabetes and hypertension. Poor candidate for long term chronic dialysis. Discussed case with family who agree on no dialysis.  - For now, continue IV Fluids, monitor closely due to congestive heart failure and hyponatremia.   2. Acute respiratory failure secondary to pneumonia and acute  exacerbation of congestive heart failure.  Now getting IV fluids due to concern of overdiuresis.  - appreciate pulmonary input. Supportive measures, oxygen, and antibiotics.   3. Diabetes mellitus type II with chronic kidney disease: insulin dependent:  - continue glucose control  4. Hypertension: blood pressure has been at goal. Home regimen of furosemide, amlodipine - Currently not on any agents.    LOS: [place], [place] 1/7/20174:17 PM

## 2016-01-05 NOTE — Progress Notes (Signed)
[place] [facility] [place] NAME: Kylie Martinez    MR#:  [id number]  DATE OF BIRTH:  08/11/1936  SUBJECTIVE:  CHIEF COMPLAINT:   Chief Complaint  Patient presents with  . Respiratory Distress   On BiPAP at this time. Alert. No specific complaints.  REVIEW OF SYSTEMS:  Review of Systems  Constitutional: Negative for fever and chills.  Respiratory: Negative for cough, shortness of breath and wheezing.   Cardiovascular: Negative for chest pain and palpitations.  Gastrointestinal: Negative for nausea, vomiting, abdominal pain, diarrhea and constipation.  Genitourinary: Negative for dysuria.       Oliguric  Musculoskeletal: Positive for myalgias.  Neurological: Positive for weakness. Negative for dizziness, seizures and headaches.    DRUG ALLERGIES:   Allergies  Allergen Reactions  . Ace Inhibitors   . Dristan Cold [Chlorphen-Pe-Acetaminophen]   . Iodine   . Keppra [Levetiracetam]   . Latex   . Other     ARB-angiotension receptor antsgonist All topical AG treatment  . Silver     silverdeen cream  . Statins     VITALS:  Blood pressure 137/47, pulse 59, temperature 98.2 F (36.8 C), temperature source Oral, resp. rate 18, height 5\' 5"  (1.651 m), weight 105.235 kg (232 lb), SpO2 95 %.  PHYSICAL EXAMINATION:  Physical Exam  GENERAL:  80 y.o.-year-old patient sitting up in chair, BiPAP mask on, no distress EYES: Pupils equal, round, reactive to light and accommodation. No scleral icterus. Extraocular muscles intact.  HEENT: Head atraumatic, normocephalic. Oropharynx and nasopharynx clear.  NECK:  Supple, no jugular venous distention. No thyroid enlargement, no tenderness.  LUNGS: Normal breath sounds bilaterally, no wheezing, rales,rhonchi or crepitation. No use of accessory muscles of respiration. Decreased bibasilar breath sounds CARDIOVASCULAR: S1, S2 normal. No murmurs, rubs, or gallops.  ABDOMEN: Soft, nontender,  nondistended. Bowel sounds present. No organomegaly or mass.  EXTREMITIES: No pedal edema, cyanosis, or clubbing.  NEUROLOGIC: Cranial nerves II through XII are intact. Muscle strength 5/5 in all extremities. Sensation intact. Gait not checked.  PSYCHIATRIC: The patient is alert and calm SKIN: No obvious rash, lesion, or ulcer.    LABORATORY PANEL:   CBC  Recent Labs Lab 01/05/16 0537  WBC 7.6  HGB 10.9*  HCT 32.7*  PLT 158   ------------------------------------------------------------------------------------------------------------------  Chemistries   Recent Labs Lab 01/05/16 0537  NA 134*  K 3.9  CL 96*  CO2 26  GLUCOSE 197*  BUN 110*  CREATININE 3.35*  CALCIUM 7.1*   ------------------------------------------------------------------------------------------------------------------  Cardiac Enzymes  Recent Labs Lab 12/30/15 0326  TROPONINI 0.09*   ------------------------------------------------------------------------------------------------------------------  RADIOLOGY:  No results found.   ASSESSMENT AND PLAN:    1. Acute on chronic systolic congestive heart failure with acute pulmonary edema, resolved, discontinue diuresis with Lasix due to renal failure. Repeat chest x-ray with improvement. -Echocardiogram with EF of 55%, improved when compared to prior echo. LVH noted and possibly diastolic dysfunction.  2. Bacterial pneumonia, healthcare associated - Clinically improved. No leukocytosis or fever. Antibiotics discontinued course completed  -3. Elevated troponin, likely demand ischemia. No chest pain, EKG change or events on telemetry  4 . Acute on chronic Renal failure- worsened with diuresis. -Appreciate nephrology consult. Family refused any dialysis. -Potassium normal -Gentle hydration and hold Lasix. -Renal ultrasound with no hydronephrosis.. -Foley catheter for strict input and output monitoring. Good urine output - cont following renal  func daily.  5. diabetes mellitus - Blood sugars improved now that steroids are stopped.  Continue sliding scale   6. Hypertension-controlled on Norvasc  7. DVT prophylaxis-on subcutaneous heparin  8. Chronic respiratory failure: Patient has been on BiPAP continuously since her admission at [facility] one month ago. She is doing very well off of BiPAP here. Pulmonology has been following. Dr. Jamal Collin has discussed BiPAP with her daughter on 1/5. The family is in favor of continuous BiPAP as prescribed at [facility] neurology. We will try a trial of interval BiPAP with 3 hours off and one hour on during the day and BiPAP at bedtime. So far for she has been doing very well with this   All the records are reviewed and case discussed with Care Management/Social Workerr. Management plans discussed with the patient, family and they are in agreement.  CODE STATUS: Full code  TOTAL TIME SPENT IN TAKING CARE OF THIS PATIENT: 40minutes.   POSSIBLE D/C IN 2  DAYS, DEPENDING ON CLINICAL CONDITION.   Vaughan Basta M.D on 01/05/2016 at 5:42 PM  Between 7am to 6pm - Pager - [phone] After 6pm go to [email or web address] - password [email or web address] [facility]  Office  [phone]  CC: Primary care physician; Juluis Pitch, MD

## 2016-01-06 LAB — BASIC METABOLIC PANEL
Anion gap: 11 (ref 5–15)
BUN: 100 mg/dL — ABNORMAL HIGH (ref 6–20)
CHLORIDE: 98 mmol/L — AB (ref 101–111)
CO2: 31 mmol/L (ref 22–32)
CREATININE: 2.9 mg/dL — AB (ref 0.44–1.00)
Calcium: 7.8 mg/dL — ABNORMAL LOW (ref 8.9–10.3)
GFR calc non Af Amer: 15 mL/min — ABNORMAL LOW (ref >60)
GFR, EST AFRICAN AMERICAN: 17 mL/min — AB (ref >60)
Glucose, Bld: 200 mg/dL — ABNORMAL HIGH (ref 65–99)
POTASSIUM: 4.2 mmol/L (ref 3.5–5.1)
Sodium: 140 mmol/L (ref 135–145)

## 2016-01-06 LAB — GLUCOSE, CAPILLARY
GLUCOSE-CAPILLARY: 127 mg/dL — AB (ref 65–99)
Glucose-Capillary: 230 mg/dL — ABNORMAL HIGH (ref 65–99)

## 2016-01-06 NOTE — Progress Notes (Signed)
Pt on bipap til 1000. 1/4 breakfast takenl iv f's continue at 50/hr. Pt up to bsc with single assist. Passed small bm. Incentive spirometry ordered per daugter's request. Cough is non productive. Pt placed back on bipap at 1430. sats 99 with this.

## 2016-01-06 NOTE — Progress Notes (Signed)
Foley catheter discontinued. Pt tolerated this well.

## 2016-01-06 NOTE — Progress Notes (Signed)
[facility]  ROUNDING NOTE   Subjective:   Laying in bed comfortably. BiPap at night. Oriented to self only.  UOP 3150 Creatinine 2.9 (3.35) (3.89) (4.53) D5 1/2NS at 63mL/hr  Objective:  Vital signs in last 24 hours:  Temp:  [97.8 F (36.6 C)-98.2 F (36.8 C)] 97.9 F (36.6 C) (01/08 1219) Pulse Rate:  [58-60] 60 (01/08 1219) Resp:  [16-24] 16 (01/08 1219) BP: (145-171)/(52-82) 145/52 mmHg (01/08 1219) SpO2:  [97 %-100 %] 98 % (01/08 1219) FiO2 (%):  [30 %] 30 % (01/08 0900) Weight:  [104.872 kg (231 lb 3.2 oz)] 104.872 kg (231 lb 3.2 oz) (01/08 0500)  Weight change: -0.363 kg (-12.8 oz) Filed Weights   01/04/16 0500 01/05/16 0500 01/06/16 0500  Weight: 101.1 kg (222 lb 14.2 oz) 105.235 kg (232 lb) 104.872 kg (231 lb 3.2 oz)    Intake/Output: I/O last 3 completed shifts: In: 1587.5 [P.O.:390; I.V.:1197.5] Out: [id number] [Urine:4050]   Intake/Output this shift:     Physical Exam: General: Laying in bed, NAD  Head: Normocephalic, atraumatic. Moist oral mucosal membranes  Eyes: Anicteric, PERRL  Neck: Supple, trachea midline  Lungs:  Clear to auscultation  Heart: Bradycardia,+murmur  Abdomen:  Soft, nontender  Extremities: no peripheral edema. Gangrenous changes to several toes bilaterally  Neurologic: Pleasant, alert to self only  Skin: No lesions  GU Foley+    Basic Metabolic Panel:  Recent Labs Lab 01/02/16 0534 01/03/16 0640 01/04/16 0107 01/05/16 0537 01/06/16 0507  NA 134* 133* 131* 134* 140  K 5.4* 4.0 3.9 3.9 4.2  CL 92* 94* 92* 96* 98*  CO2 29 25 28 26 31   GLUCOSE 73 97 184* 197* 200*  BUN 121* 127* 112* 110* 100*  CREATININE 4.76* 4.53* 3.89* 3.35* 2.90*  CALCIUM 7.7* 7.2* 7.1* 7.1* 7.8*  PHOS 6.3* 6.9* 6.8*  --   --     Liver Function Tests:  Recent Labs Lab 01/02/16 0534 01/03/16 0640 01/04/16 0107  ALBUMIN 2.9* 2.8* 2.7*   No results for input(s): LIPASE, AMYLASE in the last 168 hours. No results for input(s):  AMMONIA in the last 168 hours.  CBC:  Recent Labs Lab 12/31/15 0840 01/01/16 0446 01/04/16 0559 01/05/16 0537  WBC 16.4* 14.1* 7.7 7.6  HGB 10.1* 9.9* 10.3* 10.9*  HCT 31.4* 30.9* 31.2* 32.7*  MCV 90.6 91.7 87.9 89.5  PLT 179 189 160 158    Cardiac Enzymes: No results for input(s): CKTOTAL, CKMB, CKMBINDEX, TROPONINI in the last 168 hours.  BNP: Invalid input(s): POCBNP  CBG:  Recent Labs Lab 01/04/16 2125 01/05/16 0748 01/05/16 1147 01/05/16 1629 01/05/16 2037  GLUCAP 212* 187* 155* 174* 153*    Microbiology: Results for orders placed or performed during the hospital encounter of 12/29/15  Blood culture (routine x 2)     Status: None   Collection Time: 12/29/15 11:05 PM  Result Value Ref Range Status   Specimen Description BLOOD BLOOD RIGHT FOREARM  Final   Special Requests BOTTLES DRAWN AEROBIC AND ANAEROBIC [place]  Final   Culture NO GROWTH 5 DAYS  Final   Report Status 01/03/2016 FINAL  Final  MRSA PCR Screening     Status: None   Collection Time: 12/30/15  1:52 AM  Result Value Ref Range Status   MRSA by PCR NEGATIVE NEGATIVE Final    Comment:        The GeneXpert MRSA Assay (FDA approved for NASAL specimens only), is one component of a comprehensive MRSA colonization surveillance program.  It is not intended to diagnose MRSA infection nor to guide or monitor treatment for MRSA infections.     Coagulation Studies: No results for input(s): LABPROT, INR in the last 72 hours.  Urinalysis: No results for input(s): COLORURINE, LABSPEC, PHURINE, GLUCOSEU, HGBUR, BILIRUBINUR, KETONESUR, PROTEINUR, UROBILINOGEN, NITRITE, LEUKOCYTESUR in the last 72 hours.  Invalid input(s): APPERANCEUR    Imaging: No results found.   Medications:   . dextrose 5 % and 0.45% NaCl 50 mL/hr at 01/05/16 1915   . acidophilus  1 capsule Oral Daily  . amLODipine  10 mg Oral Daily  . antiseptic oral rinse  7 mL Mouth Rinse q12n4p  . aspirin EC  81 mg Oral Daily  .  chlorhexidine  15 mL Mouth Rinse BID  . docusate sodium  100 mg Oral BID  . ferrous sulfate  325 mg Oral Daily  . gabapentin  100 mg Oral QHS  . heparin subcutaneous  5,000 Units Subcutaneous 3 times per day  . insulin aspart  0-20 Units Subcutaneous TID WC  . insulin aspart  0-5 Units Subcutaneous QHS  . sodium chloride  3 mL Intravenous Q12H  . sodium chloride  3 mL Intravenous Q12H   sodium chloride, acetaminophen **OR** acetaminophen, bisacodyl, ipratropium-albuterol, nitroGLYCERIN, ondansetron **OR** ondansetron (ZOFRAN) IV, polyethylene glycol, sodium chloride  Assessment/ Plan:  [name] is a 80 y.o. white female SNF resident with insulin dependent diabetes mellitus type II, hypertension, atrial fibrillation, aortic stenosis, systolic congestive heart failure, dementia, peripheral arterial disease, seizure disorder, obstructive sleep apnea , who was admitted to [facility] on 12/29/2015 for Acute respiratory failure with hypoxia ([place]) [J96.01]   1. Acute renal failure with hyperkalemia, hyponatremia on chronic kidney disease stage III: baseline creatinine of 2.  Now with hyperkalemia, elevated BUN, hyponatremia. Creatinine, BUN, sodium and potassium are improving.  Acute renal failure from concurrent illness: overdiuresis with furosemide along with hypotension related to pneumonia and acute exacerbation of CHF.  Allergic to ACE-I/ARB.  Elevated BUN can be explained by systemic steroids.  Hyperkalemia treated with kayexalate on 1/3.  Chronic kidney disease suspected to be due to diabetes and hypertension. Poor candidate for long term chronic dialysis. Discussed case with family who agree on no dialysis.  - For now, continue IV Fluids for another 24 hours, monitor closely due to congestive heart failure and hyponatremia.   2. Acute respiratory failure secondary to pneumonia and acute exacerbation of congestive heart failure.  Now getting IV fluids due to concern of overdiuresis.   - Supportive measures, oxygen, and antibiotics.   3. Diabetes mellitus type II with chronic kidney disease: insulin dependent:  - continue glucose control  4. Hypertension: blood pressure has been at goal. Home regimen of furosemide, amlodipine - restarted amlodipine.    LOS: [place], [place] 1/8/201712:42 PM

## 2016-01-06 NOTE — Progress Notes (Signed)
Patient has rested quietly tonight. No complaints of pain and no signs of discomfort noted. Patient's SpO2 did start to decline around 2240 and was put on a non-rebreather at 15L/min. RT came by and started the patient's BiPAP, which she uses at night and throughout the day; SpO2 increased and stayed in the mid-upper 90s. Nursing staff will continue to monitor. No other signs of distress noted. Earleen Reaper, RN

## 2016-01-06 NOTE — Progress Notes (Signed)
Clinical Education officer, museum (CSW) received call from RN that patient's daughter Kylie Martinez is concerned about there not being a bed for patient at [facility]. Per [facility] liaison there is a bed for patient. CSW contacted patient's daughter Kylie Martinez and made her aware of above.   Blima Rich, [place] [phone]

## 2016-01-07 LAB — BASIC METABOLIC PANEL
Anion gap: 8 (ref 5–15)
BUN: 98 mg/dL — AB (ref 6–20)
CALCIUM: 7.7 mg/dL — AB (ref 8.9–10.3)
CO2: 33 mmol/L — ABNORMAL HIGH (ref 22–32)
CREATININE: 2.76 mg/dL — AB (ref 0.44–1.00)
Chloride: 99 mmol/L — ABNORMAL LOW (ref 101–111)
GFR calc Af Amer: 18 mL/min — ABNORMAL LOW (ref >60)
GFR, EST NON AFRICAN AMERICAN: 16 mL/min — AB (ref >60)
GLUCOSE: 184 mg/dL — AB (ref 65–99)
Potassium: 4.5 mmol/L (ref 3.5–5.1)
Sodium: 140 mmol/L (ref 135–145)

## 2016-01-07 LAB — GLUCOSE, CAPILLARY
GLUCOSE-CAPILLARY: 287 mg/dL — AB (ref 65–99)
GLUCOSE-CAPILLARY: 228 mg/dL — AB (ref 65–99)
Glucose-Capillary: 174 mg/dL — ABNORMAL HIGH (ref 65–99)
Glucose-Capillary: 267 mg/dL — ABNORMAL HIGH (ref 65–99)
Glucose-Capillary: 210 mg/dL — ABNORMAL HIGH (ref 65–99)

## 2016-01-07 MED ORDER — INSULIN DETEMIR 100 UNIT/ML ~~LOC~~ SOLN
5.0000 [IU] | Freq: Two times a day (BID) | SUBCUTANEOUS | Status: DC
  Administered 2016-01-07 – 2016-01-08 (×2): 5 [IU] via SUBCUTANEOUS
  Filled 2016-01-07 (×3): qty 0.05

## 2016-01-07 MED ORDER — ALBUTEROL SULFATE (2.5 MG/3ML) 0.083% IN NEBU
2.5000 mg | INHALATION_SOLUTION | Freq: Four times a day (QID) | RESPIRATORY_TRACT | Status: DC
Start: 2016-01-07 — End: 2016-01-08
  Administered 2016-01-07 – 2016-01-08 (×2): 2.5 mg via RESPIRATORY_TRACT
  Filled 2016-01-07 (×2): qty 3

## 2016-01-07 MED ORDER — INSULIN NPH (HUMAN) (ISOPHANE) 100 UNIT/ML ~~LOC~~ SUSP: 5.0000 [IU] | Freq: Two times a day (BID) | SUBCUTANEOUS | Status: DC

## 2016-01-07 MED ORDER — ALBUTEROL SULFATE (2.5 MG/3ML) 0.083% IN NEBU: 2.5000 mg | INHALATION_SOLUTION | Freq: Four times a day (QID) | RESPIRATORY_TRACT | Status: DC | PRN

## 2016-01-07 NOTE — Progress Notes (Signed)
Pt. Friend brought in pt.'s home bipap machine. RT inspected it for safety. No signs of fraying cords. biomed will be called to inspect on 02/08/16.

## 2016-01-07 NOTE — Progress Notes (Signed)
Patient up in chair, tolerated transfer well, BiPAP on per respiratory. Per Dr. Anselm Jungling, wait 2 hours before bladder scanning again, hoping that sitting up and moving around more will activate patients body to urinate. Will continue to monitor.

## 2016-01-07 NOTE — Progress Notes (Signed)
Notified Dr. Jannifer Franklin that patient has not voided since foley removal at 1700. Pt has been up to the [facility] without any results. Bladder scan revealed 333cc. Will continue to monitor and assess.

## 2016-01-07 NOTE — Care Management Important Message (Signed)
Important Message  Patient Details  Name: Kylie Martinez MRN: [id number] Date of Birth: [birth year]   Medicare Important Message Given:  Yes    Juliann Pulse A Vincentina Sollers 01/07/2016, 9:45 AM

## 2016-01-07 NOTE — H&P (Signed)
Pulmonary Consult   PATIENT NAME: Kylie Martinez    MR#:  [id number]  DATE OF BIRTH:  [birth year]  DATE OF ADMISSION:  12/29/2015  PRIMARY CARE PHYSICIAN: Juluis Pitch, MD   REQUESTING/REFERRING PHYSICIAN:Sudini  CHIEF COMPLAINT:   Chief Complaint  Patient presents with  . Respiratory Distress   resolving HISTORY OF PRESENT ILLNESS:  Patient with chronic resp failure and is back to baseline, alert and awake, follows commands On chronic biPAP at [facility] Met with daughter at bedside and discussed BiPAP setting Patient on biPAP 20 IPAP and 17 EPAP-i have adjusted these setting while in room, patient seems very comfortable with these settings  Nursing staff and Resp staff have been instructed to adjust these settings as prescribed   DRUG ALLERGIES:   Allergies  Allergen Reactions  . Ace Inhibitors   . Dristan Cold [Chlorphen-Pe-Acetaminophen]   . Iodine   . Keppra [Levetiracetam]   . Latex   . Other     ARB-angiotension receptor antsgonist All topical AG treatment  . Silver     silverdeen cream  . Statins     REVIEW OF SYSTEMS:   Review of Systems  Constitutional: Negative for fever, chills and weight loss.  Eyes: Negative for blurred vision.  Respiratory: Negative for cough, shortness of breath and wheezing.   Cardiovascular: Negative for chest pain.  Musculoskeletal: Negative.   All other systems reviewed and are negative.   MEDICATIONS AT HOME:   Prior to Admission medications   Medication Sig Start Date End Date Taking? Authorizing Provider  acidophilus (RISAQUAD) CAPS capsule Take 1 capsule by mouth daily.   Yes Historical Provider, MD  amLODipine (NORVASC) 10 MG tablet Take 10 mg by mouth daily.   Yes Historical Provider, MD  aspirin 81 MG chewable tablet Chew 81 mg by mouth daily.   Yes Historical Provider, MD  docusate sodium (COLACE) 100 MG capsule Take 100 mg by mouth 2 (two) times daily as needed for mild constipation.   Yes Historical  Provider, MD  ferrous sulfate 325 (65 FE) MG tablet Take 325 mg by mouth daily.   Yes Historical Provider, MD  furosemide (LASIX) 20 MG tablet Take 20 mg by mouth 2 (two) times daily.   Yes Historical Provider, MD  gabapentin (NEURONTIN) 100 MG capsule Take 100 mg by mouth at bedtime.   Yes Historical Provider, MD  insulin NPH Human (HUMULIN N,NOVOLIN N) 100 UNIT/ML injection Inject 20-28 Units into the skin 2 (two) times daily. 28 units in the morning and 20 units in the evening   Yes Historical Provider, MD  Multiple Vitamin (MULTIVITAMIN WITH MINERALS) TABS tablet Take 1 tablet by mouth daily.   Yes Historical Provider, MD  nitroGLYCERIN (NITROSTAT) 0.4 MG SL tablet Place 0.4 mg under the tongue every 5 (five) minutes as needed for chest pain.   Yes Historical Provider, MD  omega-3 fish oil (MAXEPA) 1000 MG CAPS capsule Take 1 capsule by mouth daily.   Yes Historical Provider, MD  Pollen Extracts (PROSTAT PO) Take 30 mLs by mouth 2 (two) times daily.   Yes Historical Provider, MD  polyethylene glycol (MIRALAX / GLYCOLAX) packet Take 17 g by mouth daily as needed for mild constipation.   Yes Historical Provider, MD  potassium chloride (KLOR-CON) 20 MEQ packet Take 20 mEq by mouth daily.   Yes Historical Provider, MD  vitamin C (ASCORBIC ACID) 500 MG tablet Take 500 mg by mouth daily.   Yes Historical Provider, MD  VITAL SIGNS:  Blood pressure 139/82, pulse 59, temperature 98.8 F (37.1 C), temperature source Oral, resp. rate 16, height 5' 5"  (1.651 m), weight 229 lb 1.6 oz (103.919 kg), SpO2 100 %.  PHYSICAL EXAMINATION:  Physical Exam  GENERAL:  80 y.o.-year-old patient lying in the bed comfortably. On biPAP EYES: Pupils equal, round, reactive to light and accommodation. No scleral icterus. Extraocular muscles intact.  HEENT: Head atraumatic, normocephalic. Oropharynx and nasopharynx clear. No oropharyngeal erythema, moist oral mucosa  NECK:  Supple, no jugular venous distention. No  thyroid enlargement, no tenderness.  LUNGS: no wheezing, BS b/l CARDIOVASCULAR: S1, S2 normal. No murmurs, rubs, or gallops.  ABDOMEN: Soft, nontender, nondistended. Bowel sounds present. No organomegaly or mass.  EXTREMITIES: No pedal edema, cyanosis, or clubbing. + 2 pedal & radial pulses b/l.   NEUROLOGIC: Cranial nerves II through XII are intact. No focal Motor or sensory deficits appreciated b/l   LABORATORY PANEL:   CBC  Recent Labs Lab 01/05/16 0537  WBC 7.6  HGB 10.9*  HCT 32.7*  PLT 158   ------------------------------------------------------------------------------------------------------------------  Chemistries   Recent Labs Lab 01/07/16 0511  NA 140  K 4.5  CL 99*  CO2 33*  GLUCOSE 184*  BUN 98*  CREATININE 2.76*  CALCIUM 7.7*   ------------------------------------------------------------------------------------------------------------------  Cardiac Enzymes No results for input(s): TROPONINI in the last 168 hours. ------------------------------------------------------------------------------------------------------------------  RADIOLOGY:  No results found.   IMPRESSION AND PLAN:  80 yo white female with acute resp failure from acute CHF exacerbation from probable acute pneumonia-resolved Patient seems to be now with baseline Resp status biPAP 20/17 is baseline settings Will place through out the day except for 1 Hour after last bite with every meal  1. Acute on chronic respiratory failure due to CHF and pneumonia-resolved Patient with chronic Resp insufficiency-on chronic BiPAP at [facility] 20/17 -will use biPAP throughout the day except for 1 hour after last bite with every meal-this plan was relayed to charge nurse and RT staff OK to stay  gen med floor-patient at baseline resp status  2.Acute on chronic systolic CHF with ejection fraction of 45% Fluid balance is a challenge  3.Bilateral healthcare acquired pneumonia -off all abx   4.  Insulin-dependent diabetes mellitus Continue her NPH insulin. Start sliding scale insulin.  -off steroids    The Patient requires high complexity decision making for assessment and support, frequent evaluation and titration of therapies.   Patient seems to be at baseline Resp status and has chronic BIPAP therapy at [facility], if the NH is able to provide NON-invasive vent support as an outpatient, then the Gen Med Floors should be more than able to handle the care of this patient. Patient on biPAP 20/17 and is very comfortable with this settings. Will re-assess resp status in 24 hrs and possible D/c back to [facility] Patricia Pesa, M.D.  [facility]  Medical Director [place] Director [facility] Cardio-Pulmonary Department

## 2016-01-07 NOTE — Progress Notes (Signed)
[facility]  ROUNDING NOTE   Subjective:   Patient's daughter is at bedside She has been short of breath since this morning Currently wheezing and sounds crackly Serum creatinine improved slightly more to 2.76 urine output 1550 cc Nursing staff reports urinary retention requiring i/o catheterization  Objective:  Vital signs in last 24 hours:  Temp:  [98 F (36.7 C)-98.8 F (37.1 C)] 98.8 F (37.1 C) (01/09 1145) Pulse Rate:  [57-60] 59 (01/09 1145) Resp:  [16-20] 16 (01/09 1145) BP: (139-153)/(57-82) 139/82 mmHg (01/09 1145) SpO2:  [95 %-100 %] 97 % (01/09 1530) FiO2 (%):  [30 %] 30 % (01/08 2009) Weight:  [103.919 kg (229 lb 1.6 oz)] 103.919 kg (229 lb 1.6 oz) (01/09 0531)  Weight change: -0.953 kg (-2 lb 1.6 oz) Filed Weights   01/05/16 0500 01/06/16 0500 01/07/16 0531  Weight: 105.235 kg (232 lb) 104.872 kg (231 lb 3.2 oz) 103.919 kg (229 lb 1.6 oz)    Intake/Output: I/O last 3 completed shifts: In: 1420.8 [P.O.:240; I.V.:1180.8] Out: 2450 [Urine:2450]   Intake/Output this shift:  Total I/O In: 760 [P.O.:360; I.V.:400] Out: -   Physical Exam: General: Laying in bed, NAD  Head: Normocephalic, atraumatic. Moist oral mucosal membranes,   Eyes/ENT: Anicteric, biPAP mask present  Neck: Supple, trachea midline  Lungs:  B/l diffuse crackles and wheezing  Heart: Bradycardia,+murmur  Abdomen:  Soft, nontender  Extremities: Gangrenous changes to several toes bilaterally  Neurologic: Pleasant, alert to self only  Skin: No lesions  GU Foley+    Basic Metabolic Panel:  Recent Labs Lab 01/02/16 0534 01/03/16 0640 01/04/16 0107 01/05/16 0537 01/06/16 0507 01/07/16 0511  NA 134* 133* 131* 134* 140 140  K 5.4* 4.0 3.9 3.9 4.2 4.5  CL 92* 94* 92* 96* 98* 99*  CO2 29 25 28 26 31  33*  GLUCOSE 73 97 184* 197* 200* 184*  BUN 121* 127* 112* 110* 100* 98*  CREATININE 4.76* 4.53* 3.89* 3.35* 2.90* 2.76*  CALCIUM 7.7* 7.2* 7.1* 7.1* 7.8* 7.7*  PHOS 6.3*  6.9* 6.8*  --   --   --     Liver Function Tests:  Recent Labs Lab 01/02/16 0534 01/03/16 0640 01/04/16 0107  ALBUMIN 2.9* 2.8* 2.7*   No results for input(s): LIPASE, AMYLASE in the last 168 hours. No results for input(s): AMMONIA in the last 168 hours.  CBC:  Recent Labs Lab 01/01/16 0446 01/04/16 0559 01/05/16 0537  WBC 14.1* 7.7 7.6  HGB 9.9* 10.3* 10.9*  HCT 30.9* 31.2* 32.7*  MCV 91.7 87.9 89.5  PLT 189 160 158    Cardiac Enzymes: No results for input(s): CKTOTAL, CKMB, CKMBINDEX, TROPONINI in the last 168 hours.  BNP: Invalid input(s): POCBNP  CBG:  Recent Labs Lab 01/06/16 2113 01/07/16 0817 01/07/16 1218 01/07/16 1220 01/07/16 1647  GLUCAP 127* 174* 287* 267* 210*    Microbiology: Results for orders placed or performed during the hospital encounter of 12/29/15  Blood culture (routine x 2)     Status: None   Collection Time: 12/29/15 11:05 PM  Result Value Ref Range Status   Specimen Description BLOOD BLOOD RIGHT FOREARM  Final   Special Requests BOTTLES DRAWN AEROBIC AND ANAEROBIC [place]  Final   Culture NO GROWTH 5 DAYS  Final   Report Status 01/03/2016 FINAL  Final  MRSA PCR Screening     Status: None   Collection Time: 12/30/15  1:52 AM  Result Value Ref Range Status   MRSA by PCR NEGATIVE NEGATIVE  Final    Comment:        The GeneXpert MRSA Assay (FDA approved for NASAL specimens only), is one component of a comprehensive MRSA colonization surveillance program. It is not intended to diagnose MRSA infection nor to guide or monitor treatment for MRSA infections.     Coagulation Studies: No results for input(s): LABPROT, INR in the last 72 hours.  Urinalysis: No results for input(s): COLORURINE, LABSPEC, PHURINE, GLUCOSEU, HGBUR, BILIRUBINUR, KETONESUR, PROTEINUR, UROBILINOGEN, NITRITE, LEUKOCYTESUR in the last 72 hours.  Invalid input(s): APPERANCEUR    Imaging: No results found.   Medications:     . acidophilus  1  capsule Oral Daily  . albuterol  2.5 mg Nebulization Q6H  . amLODipine  10 mg Oral Daily  . antiseptic oral rinse  7 mL Mouth Rinse q12n4p  . aspirin EC  81 mg Oral Daily  . chlorhexidine  15 mL Mouth Rinse BID  . docusate sodium  100 mg Oral BID  . ferrous sulfate  325 mg Oral Daily  . gabapentin  100 mg Oral QHS  . heparin subcutaneous  5,000 Units Subcutaneous 3 times per day  . insulin aspart  0-20 Units Subcutaneous TID WC  . insulin aspart  0-5 Units Subcutaneous QHS  . insulin detemir  5 Units Subcutaneous BID AC & HS  . sodium chloride  3 mL Intravenous Q12H   sodium chloride, acetaminophen **OR** acetaminophen, bisacodyl, nitroGLYCERIN, ondansetron **OR** ondansetron (ZOFRAN) IV, polyethylene glycol, sodium chloride  Assessment/ Plan:  [name] is a 80 y.o. white female SNF resident with insulin dependent diabetes mellitus type II, hypertension, atrial fibrillation, aortic stenosis, systolic congestive heart failure, dementia, peripheral arterial disease, seizure disorder, obstructive sleep apnea , who was admitted to [facility] on 12/29/2015 for Acute respiratory failure with hypoxia ([place]) [J96.01]   1. Acute renal failure with hyperkalemia, hyponatremia on chronic kidney disease stage III: baseline creatinine of 2.  Now with hyperkalemia, elevated BUN, hyponatremia.  - Creatinine, BUN, sodium and potassium are improving.  Acute renal failure from concurrent illness: overdiuresis with furosemide along with hypotension related to pneumonia and acute exacerbation of CHF.  Allergic to ACE-I/ARB.  Elevated BUN can be explained by systemic steroids.  Hyperkalemia treated with kayexalate on 1/3.  Chronic kidney disease suspected to be due to diabetes and hypertension. - Poor candidate for long term chronic dialysis. Discussed case with family by Dr Juleen China who agree on no dialysis.   2. Acute respiratory failure secondary to pneumonia and acute exacerbation of congestive  heart failure.  D/c iv fluids - Supportive measures, oxygen, and antibiotics.   3. Diabetes mellitus type II with chronic kidney disease: insulin dependent:  - continue glucose control  4. Hypertension: blood pressure has been at goal. Home regimen of furosemide, amlodipine - restarted amlodipine.   5. Urinary retention - d/c anticholinergic agents - i/o cath prn   LOS: 9 Kimiyo Carmicheal 1/9/20176:04 PM

## 2016-01-07 NOTE — Progress Notes (Signed)
No urine output since foley removed, obtained order for an in and out catherization.

## 2016-01-07 NOTE — Progress Notes (Signed)
[place] at [place] NAME: Kylie Martinez    MR#:  [id number]  DATE OF BIRTH:  04/09/1936  SUBJECTIVE:  CHIEF COMPLAINT:   Chief Complaint  Patient presents with  . Respiratory Distress   On BiPAP at this time. Alert. No specific complaints., able to take off bipap in between, and stayed stable, renal function still improving.  REVIEW OF SYSTEMS:  Review of Systems  Constitutional: Negative for fever and chills.  Respiratory: Negative for cough, shortness of breath and wheezing.   Cardiovascular: Negative for chest pain and palpitations.  Gastrointestinal: Negative for nausea, vomiting, abdominal pain, diarrhea and constipation.  Genitourinary: Negative for dysuria.       Oliguric  Musculoskeletal: Positive for myalgias.  Neurological: Positive for weakness. Negative for dizziness, seizures and headaches.    DRUG ALLERGIES:   Allergies  Allergen Reactions  . Ace Inhibitors   . Dristan Cold [Chlorphen-Pe-Acetaminophen]   . Iodine   . Keppra [Levetiracetam]   . Latex   . Other     ARB-angiotension receptor antsgonist All topical AG treatment  . Silver     silverdeen cream  . Statins     VITALS:  Blood pressure 139/82, pulse 59, temperature 98.8 F (37.1 C), temperature source Oral, resp. rate 16, height 5\' 5"  (1.651 m), weight 103.919 kg (229 lb 1.6 oz), SpO2 100 %.  PHYSICAL EXAMINATION:  Physical Exam  GENERAL:  80 y.o.-year-old patient sitting up in chair, BiPAP mask on, no distress EYES: Pupils equal, round, reactive to light and accommodation. No scleral icterus. Extraocular muscles intact.  HEENT: Head atraumatic, normocephalic. Oropharynx and nasopharynx clear.  NECK:  Supple, no jugular venous distention. No thyroid enlargement, no tenderness.  LUNGS: Normal breath sounds bilaterally, no wheezing, rales,rhonchi or crepitation. No use of accessory muscles of respiration. Decreased bibasilar breath  sounds CARDIOVASCULAR: S1, S2 normal. No murmurs, rubs, or gallops.  ABDOMEN: Soft, nontender, nondistended. Bowel sounds present. No organomegaly or mass.  EXTREMITIES: No pedal edema, cyanosis, or clubbing.  NEUROLOGIC: Cranial nerves II through XII are intact. Muscle strength 5/5 in all extremities. Sensation intact. Gait not checked.  PSYCHIATRIC: The patient is alert and calm SKIN: No obvious rash, lesion, or ulcer.    LABORATORY PANEL:   CBC  Recent Labs Lab 01/05/16 0537  WBC 7.6  HGB 10.9*  HCT 32.7*  PLT 158   ------------------------------------------------------------------------------------------------------------------  Chemistries   Recent Labs Lab 01/07/16 0511  NA 140  K 4.5  CL 99*  CO2 33*  GLUCOSE 184*  BUN 98*  CREATININE 2.76*  CALCIUM 7.7*   ------------------------------------------------------------------------------------------------------------------  Cardiac Enzymes No results for input(s): TROPONINI in the last 168 hours. ------------------------------------------------------------------------------------------------------------------  RADIOLOGY:  No results found.   ASSESSMENT AND PLAN:    1. Acute on chronic systolic congestive heart failure with acute pulmonary edema, resolved, discontinue diuresis with Lasix due to renal failure. Repeat chest x-ray with improvement. -Echocardiogram with EF of 55%, improved when compared to prior echo. LVH noted and possibly diastolic dysfunction.  not in fluid overload.  2. Bacterial pneumonia, healthcare associated - Clinically improved. No leukocytosis or fever. Antibiotics discontinued course completed  -3. Elevated troponin, likely demand ischemia. No chest pain, EKG change or events on telemetry  4 . Acute on chronic Renal failure- worsened with diuresis. -Appreciate nephrology consult. Family refused any dialysis. -Potassium normal -Gentle hydration and hold Lasix. -Renal ultrasound  with no hydronephrosis.. -Foley catheter for strict input and output monitoring. Good urine  output - cont following renal func daily. - waiting for clearance from nephrology team, and some more improvement in renal func for discharge.  5. diabetes mellitus - Blood sugars improved now that steroids are stopped. Continue sliding scale  - added low dose of NPH.  6. Hypertension-controlled on Norvasc  7. DVT prophylaxis-on subcutaneous heparin  8. Chronic respiratory failure: Patient has been on BiPAP continuously since her admission at [facility] one month ago. She is doing very well off of BiPAP here. Pulmonology has been following. Dr. Jamal Collin has discussed BiPAP with her daughter on 1/5. The family is in favor of continuous BiPAP as prescribed at [facility] neurology. We will try a trial of interval BiPAP with 3 hours off and one hour on during the day and BiPAP at bedtime. So far for she has been doing very well with this   All the records are reviewed and case discussed with Care Management/Social Workerr. Management plans discussed with the patient, family and they are in agreement.  CODE STATUS: Full code  TOTAL TIME SPENT IN TAKING CARE OF THIS PATIENT: 30 minutes.   POSSIBLE D/C IN 1- 2  DAYS, DEPENDING ON CLINICAL CONDITION.   Vaughan Basta M.D on 01/07/2016 at 1:37 PM  Between 7am to 6pm - Pager - [phone] After 6pm go to [email or web address] - password [email or web address] [place] [facility]  Office  [phone]  CC: Primary care physician; Juluis Pitch, MD

## 2016-01-07 NOTE — Progress Notes (Signed)
Left foot toe dressing changed per Md order, pt tolerated well, will continue to assess

## 2016-01-07 NOTE — Progress Notes (Signed)
Per MD patient is not medically stable for D/C today. Clinical Education officer, museum (CSW) made [employer] liaison aware of above.   Blima Rich, [place] [phone]

## 2016-01-07 NOTE — Progress Notes (Signed)
[place] at [place] NAME: Kylie Martinez    MR#:  [id number]  DATE OF BIRTH:  30-Jan-1936  SUBJECTIVE:  CHIEF COMPLAINT:   Chief Complaint  Patient presents with  . Respiratory Distress   Sitting in bed, have some wheezing today, on and off on bipap. renal function still improving.  REVIEW OF SYSTEMS:  Review of Systems  Constitutional: Negative for fever and chills.  Respiratory: Positive for cough, shortness of breath and wheezing.   Cardiovascular: Negative for chest pain and palpitations.  Gastrointestinal: Negative for nausea, vomiting, abdominal pain, diarrhea and constipation.  Genitourinary: Negative for dysuria.       Oliguric  Musculoskeletal: Positive for myalgias.  Neurological: Positive for weakness. Negative for dizziness, seizures and headaches.    DRUG ALLERGIES:   Allergies  Allergen Reactions  . Ace Inhibitors   . Dristan Cold [Chlorphen-Pe-Acetaminophen]   . Iodine   . Keppra [Levetiracetam]   . Latex   . Other     ARB-angiotension receptor antsgonist All topical AG treatment  . Silver     silverdeen cream  . Statins     VITALS:  Blood pressure 151/46, pulse 58, temperature 97.4 F (36.3 C), temperature source Axillary, resp. rate 20, height 5\' 5"  (1.651 m), weight 103.919 kg (229 lb 1.6 oz), SpO2 96 %.  PHYSICAL EXAMINATION:  Physical Exam  GENERAL:  80 y.o.-year-old patient sitting up in chair, BiPAP mask on, no distress EYES: Pupils equal, round, reactive to light and accommodation. No scleral icterus. Extraocular muscles intact.  HEENT: Head atraumatic, normocephalic. Oropharynx and nasopharynx clear.  NECK:  Supple, no jugular venous distention. No thyroid enlargement, no tenderness.  LUNGS: Normal breath sounds bilaterally, She have some wheezing, Mild lower lobes crepitation. No use of accessory muscles of respiration. Decreased bibasilar breath sounds. CARDIOVASCULAR: S1, S2 normal. No  murmurs, rubs, or gallops.  ABDOMEN: Soft, nontender, nondistended. Bowel sounds present. No organomegaly or mass.  EXTREMITIES: No pedal edema, cyanosis, or clubbing.  NEUROLOGIC: Cranial nerves II through XII are intact. Muscle strength 5/5 in all extremities. Sensation intact. Gait not checked.  PSYCHIATRIC: The patient is alert and oriented. SKIN: No obvious rash, lesion, or ulcer.    LABORATORY PANEL:   CBC  Recent Labs Lab 01/05/16 0537  WBC 7.6  HGB 10.9*  HCT 32.7*  PLT 158   ------------------------------------------------------------------------------------------------------------------  Chemistries   Recent Labs Lab 01/07/16 0511  NA 140  K 4.5  CL 99*  CO2 33*  GLUCOSE 184*  BUN 98*  CREATININE 2.76*  CALCIUM 7.7*   ------------------------------------------------------------------------------------------------------------------  Cardiac Enzymes No results for input(s): TROPONINI in the last 168 hours. ------------------------------------------------------------------------------------------------------------------  RADIOLOGY:  No results found.   ASSESSMENT AND PLAN:    1. Acute on chronic systolic congestive heart failure  resolved, discontinued diuresis with Lasix due to renal failure.   Repeat chest x-ray with improvement. -Echocardiogram with EF of 55%, improved when compared to prior echo. LVH noted and possibly diastolic dysfunction.  not in fluid overload.  2. Bacterial pneumonia, healthcare associated - Clinically improved. No leukocytosis or fever. Antibiotics discontinued course completed  -3. Elevated troponin, likely demand ischemia. No chest pain, EKG change or events on telemetry  4 . Acute on chronic Renal failure- worsened with diuresis. -Appreciate nephrology consult. Family refused any dialysis. -Potassium normal -Gentle hydration and held Lasix. -Renal ultrasound with no hydronephrosis.. -Foley catheter for strict input  and output monitoring.  Have Good urine output - cont  following renal func daily. - waiting for clearance from nephrology team, and some more improvement in renal func for discharge. - stopped IV fluid today as she have some crepitations.  5. diabetes mellitus - Blood sugars improved now that steroids are stopped. Continue sliding scale  - added low dose of NPH.  6. Hypertension-controlled on Norvasc  7. DVT prophylaxis-on subcutaneous heparin  8. Chronic respiratory failure: Patient has been on BiPAP continuously since her admission at [facility] one month ago.   Dr. Jamal Collin has discussed BiPAP with her daughter on 1/5. The family is in favor of continuous BiPAP as prescribed at [facility] neurology. As per pulmonary given trial of interval BiPAP with 3 hours off and one hour on during the day and BiPAP at bedtime. So far for she has been doing very well with this. Today again had some wheezing and crepitation- so called Pulmonary for follow up.  9.  generalized weakness   Need rehab on d/c.   All the records are reviewed and case discussed with Care Management/Social Workerr. Management plans discussed with the patient, family and they are in agreement.  CODE STATUS: Full code  TOTAL TIME SPENT IN TAKING CARE OF THIS PATIENT: 50 minutes.  Spoke to pt's daughter Lattie Haw in her room ,amd spoke to [facility] on phone. Also discussed the case with Dr. Candiss Norse and Dr. Mortimer Fries.  POSSIBLE D/C IN 1- 2  DAYS, DEPENDING ON CLINICAL CONDITION.   Vaughan Basta M.D on 01/07/2016 at 9:06 PM  Between 7am to 6pm - Pager - [phone] After 6pm go to [email or web address] - password [email or web address] [place] [facility]  Office  [phone]  CC: Primary care physician; Juluis Pitch, MD

## 2016-01-07 NOTE — Progress Notes (Signed)
Patient up to bathroom with NT and daughter assisting to attempt to urinate. Bladder scan revealed 330ml urine retained, MD paged [phone] no response yet.

## 2016-01-07 NOTE — Plan of Care (Signed)
Problem: Activity: Goal: Risk for activity intolerance will decrease Outcome: Progressing Patient up to bathroom, chair and bedside commode during the shift.

## 2016-01-07 NOTE — Progress Notes (Signed)
Pt hadnt voided, got on BSC with no success, bladder scanned 398 ml, Dr. Anselm Jungling made aware, no new orders at this time

## 2016-01-07 NOTE — Progress Notes (Signed)
Family at bedside requesting that nursing staff walk with patient to bathroom when patient needs to use it. Family reports they are aware she will get short of breath and is deconditioned. Family reports they believe her BiPAP is not continuous, so she can walk to bathroom without it temporarily. Will attempt at next opportunity with caution and nursing judgement.

## 2016-01-08 LAB — BASIC METABOLIC PANEL
ANION GAP: 8 (ref 5–15)
BUN: 95 mg/dL — ABNORMAL HIGH (ref 6–20)
CALCIUM: 8 mg/dL — AB (ref 8.9–10.3)
CO2: 32 mmol/L (ref 22–32)
CREATININE: 2.85 mg/dL — AB (ref 0.44–1.00)
Chloride: 99 mmol/L — ABNORMAL LOW (ref 101–111)
GFR, EST AFRICAN AMERICAN: 17 mL/min — AB (ref >60)
GFR, EST NON AFRICAN AMERICAN: 15 mL/min — AB (ref >60)
Glucose, Bld: 135 mg/dL — ABNORMAL HIGH (ref 65–99)
Potassium: 4.1 mmol/L (ref 3.5–5.1)
SODIUM: 139 mmol/L (ref 135–145)

## 2016-01-08 LAB — GLUCOSE, CAPILLARY
GLUCOSE-CAPILLARY: 124 mg/dL — AB (ref 65–99)
GLUCOSE-CAPILLARY: 148 mg/dL — AB (ref 65–99)

## 2016-01-08 MED ORDER — FUROSEMIDE 20 MG PO TABS: 20.0000 mg | ORAL_TABLET | ORAL | Status: AC | PRN

## 2016-01-08 MED ORDER — INSULIN NPH (HUMAN) (ISOPHANE) 100 UNIT/ML ~~LOC~~ SUSP: 5.0000 [IU] | Freq: Two times a day (BID) | SUBCUTANEOUS | Status: AC

## 2016-01-08 MED ORDER — ALBUTEROL SULFATE (2.5 MG/3ML) 0.083% IN NEBU: 2.5000 mg | INHALATION_SOLUTION | Freq: Four times a day (QID) | RESPIRATORY_TRACT | Status: AC | PRN

## 2016-01-08 MED ORDER — ALBUTEROL SULFATE (2.5 MG/3ML) 0.083% IN NEBU
2.5000 mg | INHALATION_SOLUTION | Freq: Three times a day (TID) | RESPIRATORY_TRACT | Status: DC
  Administered 2016-01-08 (×2): 2.5 mg via RESPIRATORY_TRACT
  Filled 2016-01-08 (×2): qty 3

## 2016-01-08 NOTE — Discharge Summary (Signed)
[place] at [place] NAME: Kylie Martinez    MR#:  [id number]  DATE OF BIRTH:  12-02-1936  DATE OF ADMISSION:  12/29/2015 ADMITTING PHYSICIAN: Hillary Bow, MD  DATE OF DISCHARGE: 01/08/2016  PRIMARY CARE PHYSICIAN: Juluis Pitch, MD    ADMISSION DIAGNOSIS:  Acute respiratory failure with hypoxia ([place]) [J96.01]  DISCHARGE DIAGNOSIS:  Active Problems:   CHF (congestive heart failure) ([place])   Ac on ch renal failure.  SECONDARY DIAGNOSIS:   Past Medical History  Diagnosis Date  . Hypokalemia   . UTI (lower urinary tract infection)   . Vaginitis   . Cognitive communication deficit   . Pacemaker   . Sleep apnea   . Hypertension   . Chronic kidney disease   . Malignant neoplasm ([place]) skin of breast  . Diabetes mellitus without complication ([place])   . Spinal stenosis   . Chronic foot ulcer ([place])   . A-fib ([place])   . Aortic stenosis, moderate   . Dementia   . PAD (peripheral artery disease) ([place])   . Seizure [facility])     HOSPITAL COURSE:   1. Acute on chronic systolic congestive heart failure resolved, discontinued diuresis with Lasix due to renal failure.  Repeat chest x-ray with improvement. -Echocardiogram with EF of 55%, improved when compared to prior echo. LVH noted and possibly diastolic dysfunction. not in fluid overload.   Advised about fluid and salt restriction and daily weight- and to take lasix only if needed.  2. Bacterial pneumonia, healthcare associated - Clinically improved. No leukocytosis or fever. Antibiotics discontinued course completed  -3. Elevated troponin, likely demand ischemia. No chest pain, EKG change or events on telemetry  4 . Acute on chronic Renal failure- worsened with diuresis. -Appreciate nephrology consult. Family refused any dialysis. -Potassium normal -Gentle hydration and held Lasix. -Renal ultrasound with no hydronephrosis.. -Foley catheter for strict input and output  monitoring. Have Good urine output- foley removed and able to urinate properly. - cont following renal func daily. - Nephrology suggested discharge and follow up with her nephrologist in 2-3 weeks and frequent checks of her renal function by PMD>.  5. diabetes mellitus - Blood sugars improved now that steroids are stopped. Continue sliding scale  - added low dose of NPH. Dose decreased due to worse renal function.  6. Hypertension-controlled on Norvasc  7. DVT prophylaxis-on subcutaneous heparin  8. [place] respi failure due to COPD and CHF   On Bipap Chronically- Use whole day except 1 hour after finishing each meal. Setting IPAP -20, EPAP - 17.  DISCHARGE CONDITIONS:   Stable.  CONSULTS OBTAINED:  Treatment Team:  Teodoro Spray, MD Lavonia Dana, MD  DRUG ALLERGIES:   Allergies  Allergen Reactions  . Ace Inhibitors   . Dristan Cold [Chlorphen-Pe-Acetaminophen]   . Iodine   . Keppra [Levetiracetam]   . Latex   . Other     ARB-angiotension receptor antsgonist All topical AG treatment  . Silver     silverdeen cream  . Statins     DISCHARGE MEDICATIONS:   Current Discharge Medication List    START taking these medications   Details  albuterol (PROVENTIL) (2.5 MG/3ML) 0.083% nebulizer solution Take 3 mLs (2.5 mg total) by nebulization every 6 (six) hours as needed for wheezing or shortness of breath. Qty: 75 mL, Refills: 12      CONTINUE these medications which have CHANGED   Details  furosemide (LASIX) 20 MG tablet Take 1 tablet (  20 mg total) by mouth as needed for fluid or edema. Qty: 30 tablet, Refills: 0    insulin NPH Human (HUMULIN N,NOVOLIN N) 100 UNIT/ML injection Inject 0.05 mLs (5 Units total) into the skin 2 (two) times daily. 28 units in the morning and 20 units in the evening Qty: 10 mL, Refills: 11      CONTINUE these medications which have NOT CHANGED   Details  acidophilus (RISAQUAD) CAPS capsule Take 1 capsule by mouth daily.    amLODipine  (NORVASC) 10 MG tablet Take 10 mg by mouth daily.    aspirin 81 MG chewable tablet Chew 81 mg by mouth daily.    docusate sodium (COLACE) 100 MG capsule Take 100 mg by mouth 2 (two) times daily as needed for mild constipation.    ferrous sulfate 325 (65 FE) MG tablet Take 325 mg by mouth daily.    gabapentin (NEURONTIN) 100 MG capsule Take 100 mg by mouth at bedtime.    Multiple Vitamin (MULTIVITAMIN WITH MINERALS) TABS tablet Take 1 tablet by mouth daily.    nitroGLYCERIN (NITROSTAT) 0.4 MG SL tablet Place 0.4 mg under the tongue every 5 (five) minutes as needed for chest pain.    omega-3 fish oil (MAXEPA) 1000 MG CAPS capsule Take 1 capsule by mouth daily.    Pollen Extracts (PROSTAT PO) Take 30 mLs by mouth 2 (two) times daily.    polyethylene glycol (MIRALAX / GLYCOLAX) packet Take 17 g by mouth daily as needed for mild constipation.    vitamin C (ASCORBIC ACID) 500 MG tablet Take 500 mg by mouth daily.      STOP taking these medications     potassium chloride (KLOR-CON) 20 MEQ packet          DISCHARGE INSTRUCTIONS:    Use Bipap 20 IPAP and 17 EPAP, use BiPAP throughout the day and night except for 1 hour after last bite with every meal.  Fluid restriction up to 1300 ml daily  Low salt diet.  Daily weigh your self, if > 2 Lb gain in a day or > 5 Lb in 1 week- take lasix 20 mg one times a day for 2 days- and still not able to get rid of extra weight- call your doctor or cardiologist.  To check Kidney function in 1-2 weeks with PMD, and to follow with a nephrologist regularly.   If you experience worsening of your admission symptoms, develop shortness of breath, life threatening emergency, suicidal or homicidal thoughts you must seek medical attention immediately by calling 911 or calling your MD immediately  if symptoms less severe.  You Must read complete instructions/literature along with all the possible adverse reactions/side effects for all the Medicines you take  and that have been prescribed to you. Take any new Medicines after you have completely understood and accept all the possible adverse reactions/side effects.   Please note  You were cared for by a hospitalist during your hospital stay. If you have any questions about your discharge medications or the care you received while you were in the hospital after you are discharged, you can call the unit and asked to speak with the hospitalist on call if the hospitalist that took care of you is not available. Once you are discharged, your primary care physician will handle any further medical issues. Please note that NO REFILLS for any discharge medications will be authorized once you are discharged, as it is imperative that you return to your primary care physician (or  establish a relationship with a primary care physician if you do not have one) for your aftercare needs so that they can reassess your need for medications and monitor your lab values.    Today   CHIEF COMPLAINT:   Chief Complaint  Patient presents with  . Respiratory Distress    HISTORY OF PRESENT ILLNESS:  Kylie Martinez  is a 80 y.o. female with a known history of hypertension, diabetes, chronic respiratory failure, chronic systolic congestive heart failure with ejection fraction 45% is BiPAP dependent presents to the emergency room with worsening respiratory status. Patient was found to have saturations of 70% at the nursing home. Patient is unable to contribute to history due to her severe respiratory distress and also dementia. History obtained from reviewing old records and discussing with family at bedside. Chest x-ray showed congestive heart failure and bilateral pneumonia. WBC elevated at 19,000.   VITAL SIGNS:  Blood pressure 149/57, pulse 59, temperature 97.6 F (36.4 C), temperature source Axillary, resp. rate 18, height 5\' 5"  (1.651 m), weight 106.051 kg (233 lb 12.8 oz), SpO2 95 %.  I/O:   Intake/Output Summary (Last  24 hours) at 01/08/16 1135 Last data filed at 01/08/16 1025  Gross per 24 hour  Intake    760 ml  Output      0 ml  Net    760 ml    PHYSICAL EXAMINATION:   GENERAL: 80 y.o.-year-old patient sitting up in chair, BiPAP mask on, no distress EYES: Pupils equal, round, reactive to light and accommodation. No scleral icterus. Extraocular muscles intact.  HEENT: Head atraumatic, normocephalic. Oropharynx and nasopharynx clear.  NECK: Supple, no jugular venous distention. No thyroid enlargement, no tenderness.  LUNGS: Normal breath sounds bilaterally, She have no wheezing, Mild lower lobes crepitation. No use of accessory muscles of respiration. Using bipap. CARDIOVASCULAR: S1, S2 normal. No murmurs, rubs, or gallops.  ABDOMEN: Soft, nontender, nondistended. Bowel sounds present. No organomegaly or mass.  EXTREMITIES: No pedal edema, cyanosis, or clubbing.  NEUROLOGIC: Cranial nerves II through XII are intact. Muscle strength 5/5 in all extremities. Sensation intact. Gait not checked.  PSYCHIATRIC: The patient is alert and oriented. SKIN: No obvious rash, lesion, or ulcer.   DATA REVIEW:   CBC  Recent Labs Lab 01/05/16 0537  WBC 7.6  HGB 10.9*  HCT 32.7*  PLT 158    Chemistries   Recent Labs Lab 01/08/16 0451  NA 139  K 4.1  CL 99*  CO2 32  GLUCOSE 135*  BUN 95*  CREATININE 2.85*  CALCIUM 8.0*    Cardiac Enzymes No results for input(s): TROPONINI in the last 168 hours.  Microbiology Results  Results for orders placed or performed during the hospital encounter of 12/29/15  Blood culture (routine x 2)     Status: None   Collection Time: 12/29/15 11:05 PM  Result Value Ref Range Status   Specimen Description BLOOD BLOOD RIGHT FOREARM  Final   Special Requests BOTTLES DRAWN AEROBIC AND ANAEROBIC [place]  Final   Culture NO GROWTH 5 DAYS  Final   Report Status 01/03/2016 FINAL  Final  MRSA PCR Screening     Status: None   Collection Time: 12/30/15  1:52 AM   Result Value Ref Range Status   MRSA by PCR NEGATIVE NEGATIVE Final    Comment:        The GeneXpert MRSA Assay (FDA approved for NASAL specimens only), is one component of a comprehensive MRSA colonization surveillance program. It is not  intended to diagnose MRSA infection nor to guide or monitor treatment for MRSA infections.     RADIOLOGY:  No results found.    Management plans discussed with the patient, family and they are in agreement.  CODE STATUS:     Code Status Orders        Start     Ordered   12/29/15 2348  Full code   Continuous     12/29/15 2349    Code Status History    Date Active Date Inactive Code Status Order ID Comments User Context   This patient has a current code status but no historical code status.    Advance Directive Documentation        Most Recent Value   Type of Advance Directive  Out of facility DNR (pink MOST or yellow form)   Pre-existing out of facility DNR order (yellow form or pink MOST form)     "MOST" Form in Place?        TOTAL TIME TAKING CARE OF THIS PATIENT: 35 minutes.    Vaughan Basta M.D on 01/08/2016 at 11:35 AM  Between 7am to 6pm - Pager - [phone]  After 6pm go to [email or web address] - password [email or web address] [place] [facility]  Office  [phone]  CC: Primary care physician; Juluis Pitch, MD   Note: This dictation was prepared with Dragon dictation along with smaller phrase technology. Any transcriptional errors that result from this process are unintentional.

## 2016-01-08 NOTE — H&P (Signed)
Pulmonary Consult   PATIENT NAME: Kylie Martinez    MR#:  [id number]  DATE OF BIRTH:  12-08-1936  DATE OF ADMISSION:  12/29/2015  PRIMARY CARE PHYSICIAN: Juluis Pitch, MD   REQUESTING/REFERRING PHYSICIAN:Sudini  CHIEF COMPLAINT:   Chief Complaint  Patient presents with  . Respiratory Distress   Resolving going back to NH today HISTORY OF PRESENT ILLNESS:  Patient feeling much better today Patient to be discharged home today Patient uses at home bipap unit 20/17   REVIEW OF SYSTEMS:   Review of Systems  Constitutional: Negative for fever, chills and weight loss.  Eyes: Negative for blurred vision.  Respiratory: Negative for cough, shortness of breath and wheezing.   Cardiovascular: Negative for chest pain.  Musculoskeletal: Negative.   All other systems reviewed and are negative.   MEDICATIONS AT HOME:   Prior to Admission medications   Medication Sig Start Date End Date Taking? Authorizing Provider  acidophilus (RISAQUAD) CAPS capsule Take 1 capsule by mouth daily.   Yes Historical Provider, MD  amLODipine (NORVASC) 10 MG tablet Take 10 mg by mouth daily.   Yes Historical Provider, MD  aspirin 81 MG chewable tablet Chew 81 mg by mouth daily.   Yes Historical Provider, MD  docusate sodium (COLACE) 100 MG capsule Take 100 mg by mouth 2 (two) times daily as needed for mild constipation.   Yes Historical Provider, MD  ferrous sulfate 325 (65 FE) MG tablet Take 325 mg by mouth daily.   Yes Historical Provider, MD  furosemide (LASIX) 20 MG tablet Take 20 mg by mouth 2 (two) times daily.   Yes Historical Provider, MD  gabapentin (NEURONTIN) 100 MG capsule Take 100 mg by mouth at bedtime.   Yes Historical Provider, MD  insulin NPH Human (HUMULIN N,NOVOLIN N) 100 UNIT/ML injection Inject 20-28 Units into the skin 2 (two) times daily. 28 units in the morning and 20 units in the evening   Yes Historical Provider, MD  Multiple Vitamin (MULTIVITAMIN WITH MINERALS) TABS tablet  Take 1 tablet by mouth daily.   Yes Historical Provider, MD  nitroGLYCERIN (NITROSTAT) 0.4 MG SL tablet Place 0.4 mg under the tongue every 5 (five) minutes as needed for chest pain.   Yes Historical Provider, MD  omega-3 fish oil (MAXEPA) 1000 MG CAPS capsule Take 1 capsule by mouth daily.   Yes Historical Provider, MD  Pollen Extracts (PROSTAT PO) Take 30 mLs by mouth 2 (two) times daily.   Yes Historical Provider, MD  polyethylene glycol (MIRALAX / GLYCOLAX) packet Take 17 g by mouth daily as needed for mild constipation.   Yes Historical Provider, MD  potassium chloride (KLOR-CON) 20 MEQ packet Take 20 mEq by mouth daily.   Yes Historical Provider, MD  vitamin C (ASCORBIC ACID) 500 MG tablet Take 500 mg by mouth daily.   Yes Historical Provider, MD      VITAL SIGNS:  Blood pressure 107/94, pulse 59, temperature 98 F (36.7 C), temperature source Axillary, resp. rate 18, height 5\' 5"  (1.651 m), weight 233 lb 12.8 oz (106.051 kg), SpO2 98 %.  PHYSICAL EXAMINATION:  Physical Exam  GENERAL:  80 y.o.-year-old patient lying in the bed comfortably. Eating comfortably EYES: Pupils equal, round, reactive to light and accommodation. No scleral icterus. Extraocular muscles intact.  HEENT: Head atraumatic, normocephalic. Oropharynx and nasopharynx clear. No oropharyngeal erythema, moist oral mucosa  NECK:  Supple, no jugular venous distention. No thyroid enlargement, no tenderness.  LUNGS: no wheezing, BS b/l CARDIOVASCULAR: S1, S2 normal.  No murmurs, rubs, or gallops.  ABDOMEN: Soft, nontender, nondistended. Bowel sounds present. No organomegaly or mass.  EXTREMITIES: No pedal edema, cyanosis, or clubbing. + 2 pedal & radial pulses b/l.   NEUROLOGIC: Cranial nerves II through XII are intact. No focal Motor or sensory deficits appreciated b/l   LABORATORY PANEL:   CBC  Recent Labs Lab 01/05/16 0537  WBC 7.6  HGB 10.9*  HCT 32.7*  PLT 158    ------------------------------------------------------------------------------------------------------------------  Chemistries   Recent Labs Lab 01/08/16 0451  NA 139  K 4.1  CL 99*  CO2 32  GLUCOSE 135*  BUN 95*  CREATININE 2.85*  CALCIUM 8.0*   ------------------------------------------------------------------------------------------------------------------    IMPRESSION AND PLAN:  80 yo white female with acute resp failure from acute CHF exacerbation from probable acute pneumonia-resolved Patient seems to be now with baseline Resp status biPAP 20/17 is baseline settings Will place through out the day except for 1 Hour after last bite with every meal  1. Acute on chronic respiratory failure due to CHF and pneumonia-resolved Patient with chronic Resp insufficiency-on chronic BiPAP at [facility] 20/17 -will use biPAP throughout the day except for 1 hour after last bite with every meal-this plan was relayed to charge nurse and RT staff OK to stay  gen med floor-patient at baseline resp status-patient to be discharged back to NH  2.Acute on chronic systolic CHF with ejection fraction of 45% Fluid balance is a challenge  3.Bilateral healthcare acquired pneumonia -off all abx   4. Insulin-dependent diabetes mellitus Continue her NPH insulin. Start sliding scale insulin.  -off steroids    The Patient requires high complexity decision making for assessment and support, frequent evaluation and titration of therapies.   Patient seems to be at baseline Resp status and has chronic BIPAP therapy at [facility], NH is able to provide NON-invasive vent support as an outpatient,  Will set up outpatient [place] Follow up in 2-4 weeks   Farmer Director [place] Director Comfort Department

## 2016-01-08 NOTE — Progress Notes (Signed)
[facility]  ROUNDING NOTE   Subjective:   Patient's daughter is at bedside Currently on NIPPV States she is ready to be d/c No acute c.o  Objective:  Vital signs in last 24 hours:  Temp:  [97.4 F (36.3 C)-98 F (36.7 C)] 98 F (36.7 C) (01/10 1136) Pulse Rate:  [58-60] 59 (01/10 1136) Resp:  [16-20] 18 (01/10 1136) BP: (107-151)/(46-94) 107/94 mmHg (01/10 1136) SpO2:  [95 %-98 %] 98 % (01/10 1136) Weight:  [106.051 kg (233 lb 12.8 oz)] 106.051 kg (233 lb 12.8 oz) (01/10 0546)  Weight change: 2.132 kg (4 lb 11.2 oz) Filed Weights   01/06/16 0500 01/07/16 0531 01/08/16 0546  Weight: 104.872 kg (231 lb 3.2 oz) 103.919 kg (229 lb 1.6 oz) 106.051 kg (233 lb 12.8 oz)    Intake/Output: I/O last 3 completed shifts: In: 1600 [P.O.:600; I.V.:1000] Out: 600 [Urine:600]   Intake/Output this shift:  Total I/O In: 120 [P.O.:120] Out: -   Physical Exam: General: Laying in bed, NAD  Head: Normocephalic, atraumatic. Moist oral mucosal membranes,   Eyes/ENT: Anicteric, biPAP mask present  Neck: Supple, trachea midline  Lungs:  B/l mild crackles  Heart: +murmur  Abdomen:  Soft, nontender  Extremities: Gangrenous changes to several toes bilaterally  Neurologic: Pleasant, alert to self only  Skin: No lesions  GU Foley+    Basic Metabolic Panel:  Recent Labs Lab 01/02/16 0534 01/03/16 0640 01/04/16 0107 01/05/16 0537 01/06/16 0507 01/07/16 0511 01/08/16 0451  NA 134* 133* 131* 134* 140 140 139  K 5.4* 4.0 3.9 3.9 4.2 4.5 4.1  CL 92* 94* 92* 96* 98* 99* 99*  CO2 29 25 28 26 31  33* 32  GLUCOSE 73 97 184* 197* 200* 184* 135*  BUN 121* 127* 112* 110* 100* 98* 95*  CREATININE 4.76* 4.53* 3.89* 3.35* 2.90* 2.76* 2.85*  CALCIUM 7.7* 7.2* 7.1* 7.1* 7.8* 7.7* 8.0*  PHOS 6.3* 6.9* 6.8*  --   --   --   --     Liver Function Tests:  Recent Labs Lab 01/02/16 0534 01/03/16 0640 01/04/16 0107  ALBUMIN 2.9* 2.8* 2.7*   No results for input(s): LIPASE,  AMYLASE in the last 168 hours. No results for input(s): AMMONIA in the last 168 hours.  CBC:  Recent Labs Lab 01/04/16 0559 01/05/16 0537  WBC 7.7 7.6  HGB 10.3* 10.9*  HCT 31.2* 32.7*  MCV 87.9 89.5  PLT 160 158    Cardiac Enzymes: No results for input(s): CKTOTAL, CKMB, CKMBINDEX, TROPONINI in the last 168 hours.  BNP: Invalid input(s): POCBNP  CBG:  Recent Labs Lab 01/07/16 1220 01/07/16 1647 01/07/16 1924 01/08/16 0734 01/08/16 1131  GLUCAP 267* 210* 228* 124* 148*    Microbiology: Results for orders placed or performed during the hospital encounter of 12/29/15  Blood culture (routine x 2)     Status: None   Collection Time: 12/29/15 11:05 PM  Result Value Ref Range Status   Specimen Description BLOOD BLOOD RIGHT FOREARM  Final   Special Requests BOTTLES DRAWN AEROBIC AND ANAEROBIC [place]  Final   Culture NO GROWTH 5 DAYS  Final   Report Status 01/03/2016 FINAL  Final  MRSA PCR Screening     Status: None   Collection Time: 12/30/15  1:52 AM  Result Value Ref Range Status   MRSA by PCR NEGATIVE NEGATIVE Final    Comment:        The GeneXpert MRSA Assay (FDA approved for NASAL specimens only), is one  component of a comprehensive MRSA colonization surveillance program. It is not intended to diagnose MRSA infection nor to guide or monitor treatment for MRSA infections.     Coagulation Studies: No results for input(s): LABPROT, INR in the last 72 hours.  Urinalysis: No results for input(s): COLORURINE, LABSPEC, PHURINE, GLUCOSEU, HGBUR, BILIRUBINUR, KETONESUR, PROTEINUR, UROBILINOGEN, NITRITE, LEUKOCYTESUR in the last 72 hours.  Invalid input(s): APPERANCEUR    Imaging: No results found.   Medications:     . acidophilus  1 capsule Oral Daily  . albuterol  2.5 mg Nebulization TID  . amLODipine  10 mg Oral Daily  . antiseptic oral rinse  7 mL Mouth Rinse q12n4p  . aspirin EC  81 mg Oral Daily  . chlorhexidine  15 mL Mouth Rinse BID  .  docusate sodium  100 mg Oral BID  . ferrous sulfate  325 mg Oral Daily  . gabapentin  100 mg Oral QHS  . heparin subcutaneous  5,000 Units Subcutaneous 3 times per day  . insulin aspart  0-20 Units Subcutaneous TID WC  . insulin aspart  0-5 Units Subcutaneous QHS  . insulin detemir  5 Units Subcutaneous BID AC & HS  . sodium chloride  3 mL Intravenous Q12H   sodium chloride, acetaminophen **OR** acetaminophen, bisacodyl, nitroGLYCERIN, ondansetron **OR** ondansetron (ZOFRAN) IV, polyethylene glycol, sodium chloride  Assessment/ Plan:  [name] is a 80 y.o. white female SNF resident with insulin dependent diabetes mellitus type II, hypertension, atrial fibrillation, aortic stenosis, systolic congestive heart failure, dementia, peripheral arterial disease, seizure disorder, obstructive sleep apnea , who was admitted to [facility] on 12/29/2015 for Acute respiratory failure with hypoxia ([place]) [J96.01]   1. Acute renal failure with hyperkalemia, hyponatremia on chronic kidney disease stage III: baseline creatinine of 2.  Now with hyperkalemia, elevated BUN, hyponatremia.  Acute renal failure from concurrent illness: overdiuresis with furosemide along with hypotension related to pneumonia and acute exacerbation of CHF.  Allergic to ACE-I/ARB.  Elevated BUN can be explained by systemic steroids.  Hyperkalemia treated with kayexalate on 1/3.  Chronic kidney disease suspected to be due to diabetes and hypertension. - Poor candidate for long term chronic dialysis. Discussed case with family by Dr Juleen China who agree on no dialysis.  - f/u outpatient with [facility] nephrology  2. Acute respiratory failure secondary to pneumonia and acute exacerbation of congestive heart failure.  D/c iv fluids - Supportive measures, oxygen, and antibiotics.   3. Diabetes mellitus type II with chronic kidney disease: insulin dependent:  - continue glucose control  4. Urinary retention - d/c anticholinergic  agents - i/o cath prn   LOS: 10 Kylie Martinez 1/10/201712:46 PM

## 2016-01-08 NOTE — NC FL2 (Signed)
[place] LEVEL OF CARE SCREENING TOOL     IDENTIFICATION  Patient Name: Kylie Martinez Birthdate: [birth year] Sex: female Admission Date (Current Location): 12/29/2015  [facility] and [place] Number:  [name]  (SN:[id number]) Facility and Address:  [facility], [address] [place], [place] [address]      Provider Number: [id number]  Attending Physician Name and Address:  Vaughan Basta, MD  Relative Name and Phone Number:       Current Level of Care: Hospital Recommended Level of Care: [place] ([facility] ) Prior Approval Number:    Date Approved/Denied:   PASRR Number:  (PO:[id number])  Discharge Plan: Domiciliary (Rest home)    Current Diagnoses: Patient Active Problem List   Diagnosis Date Noted  . CHF (congestive heart failure) ([place]) 12/29/2015    Orientation RESPIRATION BLADDER Height & Weight    Self  Other (Comment) (Bi-PAP) Incontinent 5\' 5"  (165.1 cm) 221 lbs.  BEHAVIORAL SYMPTOMS/MOOD NEUROLOGICAL BOWEL NUTRITION STATUS   (None) Convulsions/Seizures (Seizure (HCC)) Continent Diet (Carb Modified )  AMBULATORY STATUS COMMUNICATION OF NEEDS Skin   Extensive Assist Verbally Other (Comment) (Diabetic Ulcer Right toe & Diabetic Ulcer Left 3rd toe )                       Personal Care Assistance Level of Assistance  Bathing, Feeding, Dressing Bathing Assistance: Limited assistance Feeding assistance: Independent Dressing Assistance: Limited assistance     Functional Limitations Info  Sight, Hearing, Speech Sight Info: Adequate Hearing Info: Adequate Speech Info: Adequate    SPECIAL CARE FACTORS FREQUENCY                       Contractures      Additional Factors Info  Code Status, Allergies, Insulin Sliding Scale Code Status Info:  (Full Code ) Allergies Info:  (Ace Inhibitors, Dristan Cold Chlorphen-pe-acetaminophen, Iodine, Keppra Levetiracetam, Latex, ARB-angiotension  receptor antsgonist, All topical AG treatment, Silver- silverdeen cream, and Statins)   Insulin Sliding Scale Info:  (insulin aspart (novoLOG) injection 0-20 Units; 3 times daily with meals )       Current Medications (01/08/2016):  This is the current hospital active medication list Current Facility-Administered Medications  Medication Dose Route Frequency Provider Last Rate Last Dose  . 0.9 %  sodium chloride infusion  250 mL Intravenous PRN Srikar Sudini, MD      . acetaminophen (TYLENOL) tablet 650 mg  650 mg Oral Q6H PRN Hillary Bow, MD       Or  . acetaminophen (TYLENOL) suppository 650 mg  650 mg Rectal Q6H PRN Srikar Sudini, MD      . acidophilus (RISAQUAD) capsule 1 capsule  1 capsule Oral Daily Hillary Bow, MD   1 capsule at 01/08/16 0821  . albuterol (PROVENTIL) (2.5 MG/3ML) 0.083% nebulizer solution 2.5 mg  2.5 mg Nebulization TID Vaughan Basta, MD   2.5 mg at 01/08/16 0759  . amLODipine (NORVASC) tablet 10 mg  10 mg Oral Daily Hillary Bow, MD   10 mg at 01/08/16 [id number]  . antiseptic oral rinse (CPC / CETYLPYRIDINIUM CHLORIDE 0.05%) solution 7 mL  7 mL Mouth Rinse q12n4p Srikar Sudini, MD   7 mL at 01/06/16 1141  . aspirin EC tablet 81 mg  81 mg Oral Daily Hillary Bow, MD   81 mg at 01/08/16 [id number]  . bisacodyl (DULCOLAX) suppository 10 mg  10 mg Rectal Daily PRN Hillary Bow, MD      .  chlorhexidine (PERIDEX) 0.12 % solution 15 mL  15 mL Mouth Rinse BID Hillary Bow, MD   15 mL at 01/08/16 0820  . docusate sodium (COLACE) capsule 100 mg  100 mg Oral BID Hillary Bow, MD   100 mg at 01/08/16 0821  . ferrous sulfate tablet 325 mg  325 mg Oral Daily Hillary Bow, MD   325 mg at 01/08/16 [id number]  . gabapentin (NEURONTIN) capsule 100 mg  100 mg Oral QHS Srikar Sudini, MD   100 mg at 01/07/16 2023  . heparin injection 5,000 Units  5,000 Units Subcutaneous 3 times per day Gladstone Lighter, MD   5,000 Units at 01/08/16 0532  . insulin aspart (novoLOG) injection 0-20 Units   0-20 Units Subcutaneous TID WC Hillary Bow, MD   3 Units at 01/08/16 [id number]  . insulin aspart (novoLOG) injection 0-5 Units  0-5 Units Subcutaneous QHS Hillary Bow, MD   2 Units at 01/07/16 2024  . insulin detemir (LEVEMIR) injection 5 Units  5 Units Subcutaneous BID AC & HS Vaughan Basta, MD   5 Units at 01/08/16 [id number]  . nitroGLYCERIN (NITROSTAT) SL tablet 0.4 mg  0.4 mg Sublingual Q5 min PRN Srikar Sudini, MD      . ondansetron (ZOFRAN) tablet 4 mg  4 mg Oral Q6H PRN Srikar Sudini, MD       Or  . ondansetron (ZOFRAN) injection 4 mg  4 mg Intravenous Q6H PRN Hillary Bow, MD   4 mg at 12/30/15 1323  . polyethylene glycol (MIRALAX / GLYCOLAX) packet 17 g  17 g Oral Daily PRN Hillary Bow, MD   17 g at 01/06/16 1129  . sodium chloride 0.9 % injection 3 mL  3 mL Intravenous Q12H Srikar Sudini, MD   3 mL at 01/08/16 1000  . sodium chloride 0.9 % injection 3 mL  3 mL Intravenous PRN Hillary Bow, MD   3 mL at 12/30/15 0230     Discharge Medications: Please see discharge summary for a list of discharge medications.  Relevant Imaging Results:  Relevant Lab Results:   Additional Information  (SSN: [id number])  Lorenso Quarry S[place]ins, LCSW

## 2016-01-08 NOTE — Progress Notes (Signed)
Patient is medically stable for D/C back to [facility] today. Per [facility] liaison patient is going to room 104-B. RN will call report to Shelly Bombard RN at [phone] and arrange EMS for transport. Clinical Education officer, museum (CSW) sent D/C Summary (with Bi-pap settings), FL2 and D/C packet to [employer] via [place]. CSW met with patient and daughter's partner Lyn at bedside. Per Lyn patient's daughter Altha Harm is aware of D/C today. Please reconsult if future social work needs arise. CSW signing off.   Blima Rich, [place] [phone]

## 2016-01-08 NOTE — Progress Notes (Signed)
Patient has rested quietly tonight. No complaints of pain and no signs of discomfort or distress noted. Nursing staff will continue to monitor. Kylie Martinez A Breda Bond, RN 

## 2016-01-08 NOTE — Discharge Instructions (Addendum)
Use Bipap  20 IPAP and 17 EPAP, use BiPAP throughout the day and night except for 1 hour after last bite with every meal.  Fluid restriction up to 1300 ml daily Low salt diet. Daily weigh your self, if > 2 Lb gain in a day or > 5 Lb in 1 week- take lasix 20 mg one times a day for 2 days- and still not able to get rid of extra weight- call your doctor or cardiologist.  To check Kidney function in 1-2 weeks with PMD, and to follow with a nephrologist regularly.

## 2016-01-08 NOTE — Progress Notes (Signed)
EMS arrived for transport. Family at bedside, belongings packed and taken

## 2016-01-08 NOTE — Progress Notes (Signed)
Pt ready for discharge, report called to [facility]- spoke to [employer], EMS notified

## 2016-01-09 ENCOUNTER — Telehealth: Payer: Self-pay | Admitting: *Deleted

## 2016-01-09 NOTE — Telephone Encounter (Signed)
-----   Message from Flora Lipps, MD sent at 01/08/2016  1:23 PM EST ----- Please set up hospital follow up appointment wit me in 2-4 weeks. Thank you

## 2016-01-09 NOTE — Telephone Encounter (Signed)
LM for daughter to call back to schedule hosp f/u with CXR prior to appt w/[facility].

## 2016-01-11 NOTE — Telephone Encounter (Signed)
Received from daughter to schedule appt. Tried to call daughter back at [phone] to schedule appt and had to LM. Will try again on Monday.

## 2016-01-14 NOTE — Telephone Encounter (Signed)
Called spoke with daughter Altha Harm. She reports she is going to check her work schedule and call back to schedule appt.

## 2016-01-16 NOTE — Telephone Encounter (Signed)
LVM for daughter Altha Harm at [phone] to return our call.

## 2016-01-17 NOTE — Telephone Encounter (Signed)
Will send to [facility] to advise if this day would work. Thanks.

## 2016-01-17 NOTE — Telephone Encounter (Signed)
Patient's daughter, Altha Harm, returned call.  She is off on Monday 23rd so if we can do anything on that day she said that would work perfectly. [phone]

## 2016-01-17 NOTE — Telephone Encounter (Signed)
appt scheduled w/DR since [facility] has no available on the 23rd. Nothing further needed.

## 2016-01-21 ENCOUNTER — Other Ambulatory Visit: Payer: Self-pay

## 2016-01-21 ENCOUNTER — Ambulatory Visit
Admission: RE | Admit: 2016-01-21 | Discharge: 2016-01-21 | Disposition: A | Discharge status: Home / Self Care | Payer: Medicare Other | Source: Ambulatory Visit | Attending: Internal Medicine | Admitting: Internal Medicine

## 2016-01-21 ENCOUNTER — Ambulatory Visit (INDEPENDENT_AMBULATORY_CARE_PROVIDER_SITE_OTHER): Payer: Medicare Other | Admitting: Internal Medicine

## 2016-01-21 ENCOUNTER — Encounter: Payer: Self-pay | Admitting: Internal Medicine

## 2016-01-21 VITALS — BP 132/74 | HR 61 | Ht 64.0 in | Wt 211.0 lb

## 2016-01-21 DIAGNOSIS — J9612 Chronic respiratory failure with hypercapnia: Secondary | ICD-10-CM | POA: Diagnosis not present

## 2016-01-21 DIAGNOSIS — J189 Pneumonia, unspecified organism: Secondary | ICD-10-CM

## 2016-01-21 DIAGNOSIS — J90 Pleural effusion, not elsewhere classified: Secondary | ICD-10-CM | POA: Diagnosis not present

## 2016-01-21 DIAGNOSIS — J9811 Atelectasis: Secondary | ICD-10-CM | POA: Diagnosis not present

## 2016-01-21 DIAGNOSIS — I509 Heart failure, unspecified: Secondary | ICD-10-CM | POA: Insufficient documentation

## 2016-01-21 DIAGNOSIS — G4733 Obstructive sleep apnea (adult) (pediatric): Secondary | ICD-10-CM | POA: Diagnosis not present

## 2016-01-21 NOTE — Progress Notes (Signed)
* [facility]     Assessment and Plan:   chronic hypercapnic respiratory failure  - noted ABG shows a PCO2 of 65   - patient appears to be tolerating her current BiPAP pressure , her  AHI can be elevated at times his  Suspect this isdue to central  Apneic events, as she appears to be tolerating her current pressure which is somewhat high  We will continue to monitor this and alleviatedat the current pressure for the time being.  - her respiratory status is very tenuous. She becomes lethargic and altered fairly quickly , and has to be put on BiPAP multiple times per day. The patient and her daughter understand her tenuous situation they both agree that her CODE STATUS is DO NOT INTUBATE. The patient's daughter is insistent that CPR is still okay , as she feels that if she makes her mother a full no  -code then she will not receive adequate care.   pneumonia.   -her pneumonia appears to have resolved , her  respiratory status appears to be returned to baseline.  Atelectasis-Left lower lobe.   -review of most recent chest x-ray shows chronic left lower lobe atelectasis. This may be due to cardiomegaly and/or left diaphragmatic paralysis.  Debility/Weakness.   -the patient has chronic weakness. She is currently undergoing physical therapy, recommend that she continue this as this will likely help her respiratory status and atelectasis.    Date: 01/21/2016  MRN# [id number] Kylie Martinez 30-May-1936   Kylie Martinez is a 80 y.o. old female seen in follow up for chief complaint of  Chief Complaint  Patient presents with  . Hospitalization Follow-up    pt. states breathing has improved since hosp. had prod. cough brownish in color now a dry cough. denies SOB, wheezing or chest pain/tightness. pt's daughter wants to talk about bipap as well.     HPI:   the patient is a 80 year old female recently seen in the hospital by Dr. Mortimer Fries , she has a history of CHF , pneumonia  diabetes. When she was seen in the inpatient setting. It was noted that she was on a BiPAP at 20/17 every night, which is her baseline setting.  She presented to the hospital from [facility].  She feels that she is doing better and her breathing ahs recovered. She is using her bipap every night, she has occasional cough. She has an amputated toe she does not ambulate too much but she she does [facility]. She has a diagnosis of COPD, she is on no inhalers.   Currently she is using bipap qhs and prn during the day. She appears bipap dependent using 12-17 hours per day. She becomes sleepy and lethargic when she is off bipap, and needs to be put on it. Her Daughter is present and notes that she has altered mental status occasionally when off the bipap. The patient declines rapidly and so at the NH, they are putting it her on it when napping or when resting.    blood gas from 12/29/2015 :   pH 7.36 / 65/88/ 36.7. - this appears to be consistent with chronic hypercapnic respiratory failure.  echocardiogram 01/01/16: EF 55%  CXR from 01/21/16; reviewed images today, shows LLL atelectasis.   I  alsoreviewed the patient's download graphical data  -her BiPAP pressure appears to be appropriate, she is using anywhere from 12-17 hours per day, she has elevated AHI though I doubt this could be improved with increased BiPAP  pressure as the pressure is artery on the higher side.  Medication:   Outpatient Encounter Prescriptions as of 01/21/2016  Medication Sig  . acidophilus (RISAQUAD) CAPS capsule Take 1 capsule by mouth daily.  Marland Kitchen albuterol (PROVENTIL) (2.5 MG/3ML) 0.083% nebulizer solution Take 3 mLs (2.5 mg total) by nebulization every 6 (six) hours as needed for wheezing or shortness of breath.  Marland Kitchen amLODipine (NORVASC) 10 MG tablet Take 10 mg by mouth daily.  Marland Kitchen aspirin 81 MG chewable tablet Chew 81 mg by mouth daily.  Marland Kitchen docusate sodium (COLACE) 100 MG capsule Take 100 mg by mouth 2 (two) times daily as needed  for mild constipation.  . ferrous sulfate 325 (65 FE) MG tablet Take 325 mg by mouth daily.  . furosemide (LASIX) 20 MG tablet Take 1 tablet (20 mg total) by mouth as needed for fluid or edema.  . gabapentin (NEURONTIN) 100 MG capsule Take 100 mg by mouth at bedtime.  . insulin NPH Human (HUMULIN N,NOVOLIN N) 100 UNIT/ML injection Inject 0.05 mLs (5 Units total) into the skin 2 (two) times daily. 28 units in the morning and 20 units in the evening  . Multiple Vitamin (MULTIVITAMIN WITH MINERALS) TABS tablet Take 1 tablet by mouth daily.  . nitroGLYCERIN (NITROSTAT) 0.4 MG SL tablet Place 0.4 mg under the tongue every 5 (five) minutes as needed for chest pain.  Marland Kitchen omega-3 fish oil (MAXEPA) 1000 MG CAPS capsule Take 1 capsule by mouth daily.  . Pollen Extracts (PROSTAT PO) Take 30 mLs by mouth 2 (two) times daily.  . polyethylene glycol (MIRALAX / GLYCOLAX) packet Take 17 g by mouth daily as needed for mild constipation.  . vitamin C (ASCORBIC ACID) 500 MG tablet Take 500 mg by mouth daily.   No facility-administered encounter medications on file as of 01/21/2016.     Allergies:  Ace inhibitors; Dristan cold; Iodine; Keppra; Latex; Other; Silver; and Statins  Review of Systems: Gen:  Denies  fever, sweats. HEENT: Denies blurred vision. Cvc:  No dizziness, chest pain or heaviness Resp:   Denies cough or sputum porduction. Gi: Denies swallowing difficulty, stomach pain.  Gu:  Denies bladder incontinence,  Ext:   No Joint pain, stiffness. Skin: No skin rash, easy bruising. Endoc:  No polyuria, polydipsia. Psych: No depression, insomnia. Other:  All other systems were reviewed and found to be negative other than what is mentioned in the HPI.   Physical Examination:   VS: BP 132/74 mmHg  Pulse 61  Ht 5\' 4"  (1.626 m)  Wt 211 lb (95.709 kg)  BMI 36.20 kg/m2  SpO2 96%  General Appearance: No distress  Neuro:without focal findings,  speech normal,  HEENT: PERRLA, EOM intact. Pulmonary:  normal breath sounds, decreased air entry in the left base.  CardiovascularNormal S1,S2.  No m/r/g.   Abdomen: Benign, Soft, non-tender. Renal:  No costovertebral tenderness  GU:  Not performed at this time. Endoc: No evident thyromegaly, no signs of acromegaly. Skin:   warm, no rash. Extremities: normal, no cyanosis, clubbing.   LABORATORY PANEL:   CBC No results for input(s): WBC, HGB, HCT, PLT in the last 168 hours. ------------------------------------------------------------------------------------------------------------------  Chemistries  No results for input(s): NA, K, CL, CO2, GLUCOSE, BUN, CREATININE, CALCIUM, MG, AST, ALT, ALKPHOS, BILITOT in the last 168 hours.  Invalid input(s): GFRCGP ------------------------------------------------------------------------------------------------------------------  Cardiac Enzymes No results for input(s): TROPONINI in the last 168 hours. ------------------------------------------------------------  RADIOLOGY:   No results found for this or any previous visit. No results found  for this or any previous visit. ------------------------------------------------------------------------------------------------------------------  Thank  you for allowing [facility] to assist in the care of your patient. Our recommendations are noted above.  Please contact us if we can be of further service.   Marda Stalker, MD.   Pulmonary [facility] Critical Care Office Number: [phone]  Patricia Pesa, M.D.  Vilinda Boehringer, M.D.  Merton Border, M.D

## 2016-01-21 NOTE — Addendum Note (Signed)
Addended by: Maryanna Shape A on: 01/21/2016 02:54 PM   Modules accepted: Orders

## 2016-01-21 NOTE — Patient Instructions (Addendum)
--  Continue bipap every night and during day.  --PFT before next visit.

## 2016-01-22 ENCOUNTER — Telehealth: Payer: Self-pay | Admitting: Internal Medicine

## 2016-01-23 NOTE — Telephone Encounter (Signed)
Kylie Martinez,  Pt will need a different DME company due to [place] doesn't participate in Medicare.

## 2016-01-23 NOTE — Telephone Encounter (Signed)
Called and spoke with Nichole at [facility] at the below phone number.  Advised Nichole that [facility] hasn't sent any referral or order for any service to [facility].  There is not any DME orders in for this patient. No action needed at this time. Phone note closed. Rhonda J Cobb

## 2016-02-16 ENCOUNTER — Other Ambulatory Visit
Admission: RE | Admit: 2016-02-16 | Discharge: 2016-02-16 | Disposition: A | Discharge status: Home / Self Care | Payer: Medicare Other | Source: Ambulatory Visit | Attending: Family Medicine | Admitting: Family Medicine

## 2016-02-16 DIAGNOSIS — N390 Urinary tract infection, site not specified: Secondary | ICD-10-CM | POA: Diagnosis present

## 2016-02-16 LAB — URINALYSIS COMPLETE WITH MICROSCOPIC (ARMC ONLY)
BILIRUBIN URINE: NEGATIVE
GLUCOSE, UA: NEGATIVE mg/dL
Hgb urine dipstick: NEGATIVE
Ketones, ur: NEGATIVE mg/dL
NITRITE: POSITIVE — AB
PH: 5 (ref 5.0–8.0)
Protein, ur: NEGATIVE mg/dL
Specific Gravity, Urine: 1.008 (ref 1.005–1.030)

## 2016-02-16 LAB — CBC WITH DIFFERENTIAL/PLATELET
BASOS ABS: 0 10*3/uL (ref 0–0.1)
Basophils Relative: 1 %
EOS ABS: 0.1 10*3/uL (ref 0–0.7)
EOS PCT: 3 %
HCT: 34.2 % — ABNORMAL LOW (ref 35.0–47.0)
Hemoglobin: 11.2 g/dL — ABNORMAL LOW (ref 12.0–16.0)
LYMPHS PCT: 37 %
Lymphs Abs: 1.2 10*3/uL (ref 1.0–3.6)
MCH: 30.7 pg (ref 26.0–34.0)
MCHC: 32.8 g/dL (ref 32.0–36.0)
MCV: 93.5 fL (ref 80.0–100.0)
Monocytes Absolute: 0.4 10*3/uL (ref 0.2–0.9)
Monocytes Relative: 12 %
Neutro Abs: 1.5 10*3/uL (ref 1.4–6.5)
Neutrophils Relative %: 47 %
PLATELETS: 143 10*3/uL — AB (ref 150–440)
RBC: 3.66 MIL/uL — AB (ref 3.80–5.20)
RDW: 15.1 % — ABNORMAL HIGH (ref 11.5–14.5)
WBC: 3.3 10*3/uL — AB (ref 3.6–11.0)

## 2016-02-16 LAB — COMPREHENSIVE METABOLIC PANEL
ALBUMIN: 3.5 g/dL (ref 3.5–5.0)
ALT: 19 U/L (ref 14–54)
AST: 19 U/L (ref 15–41)
Alkaline Phosphatase: 42 U/L (ref 38–126)
Anion gap: 9 (ref 5–15)
BUN: 80 mg/dL — AB (ref 6–20)
CHLORIDE: 103 mmol/L (ref 101–111)
CO2: 32 mmol/L (ref 22–32)
Calcium: 8.9 mg/dL (ref 8.9–10.3)
Creatinine, Ser: 2.04 mg/dL — ABNORMAL HIGH (ref 0.44–1.00)
GFR calc Af Amer: 26 mL/min — ABNORMAL LOW (ref >60)
GFR calc non Af Amer: 22 mL/min — ABNORMAL LOW (ref >60)
GLUCOSE: 109 mg/dL — AB (ref 65–99)
POTASSIUM: 3.5 mmol/L (ref 3.5–5.1)
SODIUM: 144 mmol/L (ref 135–145)
Total Bilirubin: 0.9 mg/dL (ref 0.3–1.2)
Total Protein: 6.9 g/dL (ref 6.5–8.1)

## 2016-02-16 LAB — LIPID PANEL
Cholesterol: 182 mg/dL (ref 0–200)
HDL: 49 mg/dL (ref >40)
LDL Cholesterol: 122 mg/dL — ABNORMAL HIGH (ref 0–99)
Total CHOL/HDL Ratio: 3.7 RATIO
Triglycerides: 53 mg/dL (ref <150)
VLDL: 11 mg/dL (ref 0–40)

## 2016-02-16 LAB — HEMOGLOBIN A1C: HEMOGLOBIN A1C: 6.4 % — AB (ref 4.0–6.0)

## 2016-02-18 LAB — URINE CULTURE: Culture: >=100000

## 2016-02-20 ENCOUNTER — Other Ambulatory Visit
Admission: RE | Admit: 2016-02-20 | Discharge: 2016-02-20 | Disposition: A | Discharge status: Home / Self Care | Payer: Medicare Other | Source: Ambulatory Visit | Attending: Family Medicine | Admitting: Family Medicine

## 2016-02-20 DIAGNOSIS — I5023 Acute on chronic systolic (congestive) heart failure: Secondary | ICD-10-CM | POA: Insufficient documentation

## 2016-02-21 ENCOUNTER — Other Ambulatory Visit
Admission: RE | Admit: 2016-02-21 | Discharge: 2016-02-21 | Disposition: A | Discharge status: Home / Self Care | Payer: Medicare Other | Source: Skilled Nursing Facility | Attending: Family Medicine | Admitting: Family Medicine

## 2016-02-21 DIAGNOSIS — I509 Heart failure, unspecified: Secondary | ICD-10-CM | POA: Diagnosis present

## 2016-02-21 LAB — BRAIN NATRIURETIC PEPTIDE: B NATRIURETIC PEPTIDE 5: 693 pg/mL — AB (ref 0.0–100.0)

## 2016-06-05 ENCOUNTER — Telehealth: Payer: Self-pay | Admitting: Internal Medicine

## 2016-06-05 NOTE — Telephone Encounter (Signed)
Spoke with daughter. She ask that I inform you.

## 2016-06-05 NOTE — Telephone Encounter (Signed)
Pt daughter calling to let us know patient passed away.

## 2016-06-28 DEATH — deceased

## 2017-07-03 IMAGING — CR DG CHEST 1V PORT
1 series · 2 of 2 positions shown · non-contrast
Comparison: 01/01/2016

CLINICAL DATA: Shortness of breath and hypoxia

EXAM:
PORTABLE CHEST - 1 VIEW

[Series 1: ap · 0.17mm/px · 2 of 2 slices shown]
[im 1/2]
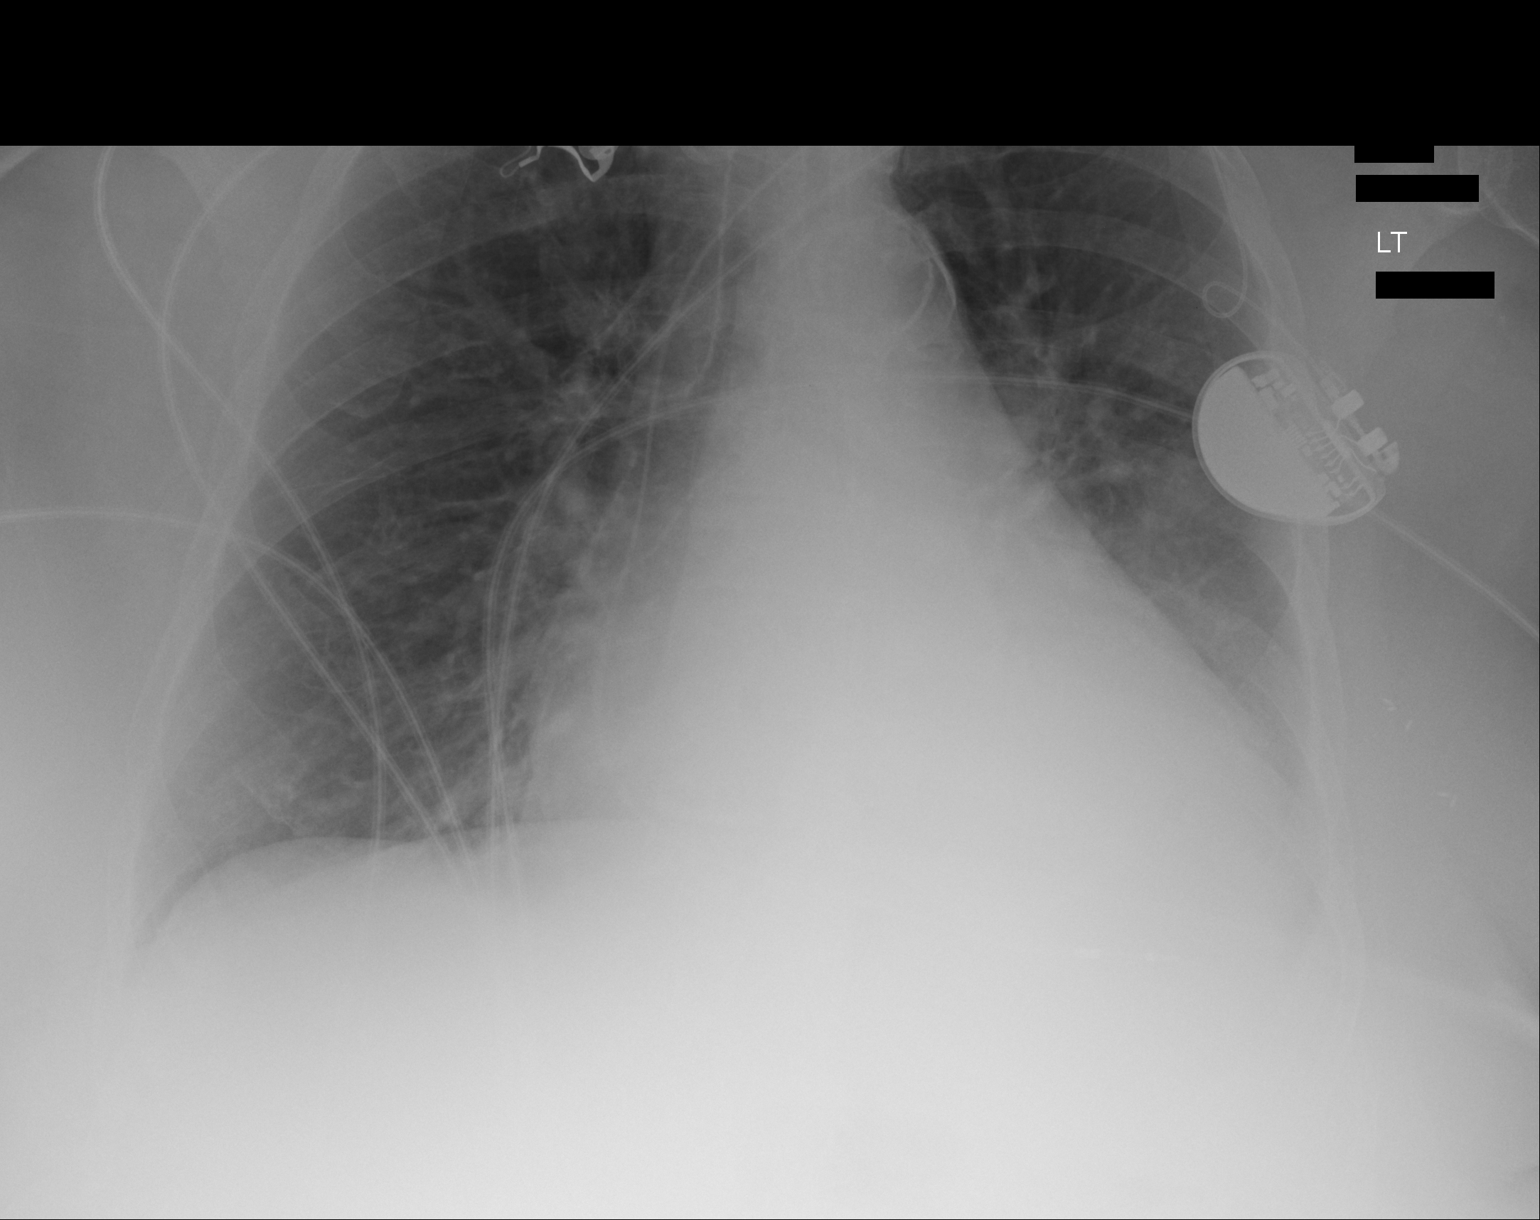
[im 2/2]
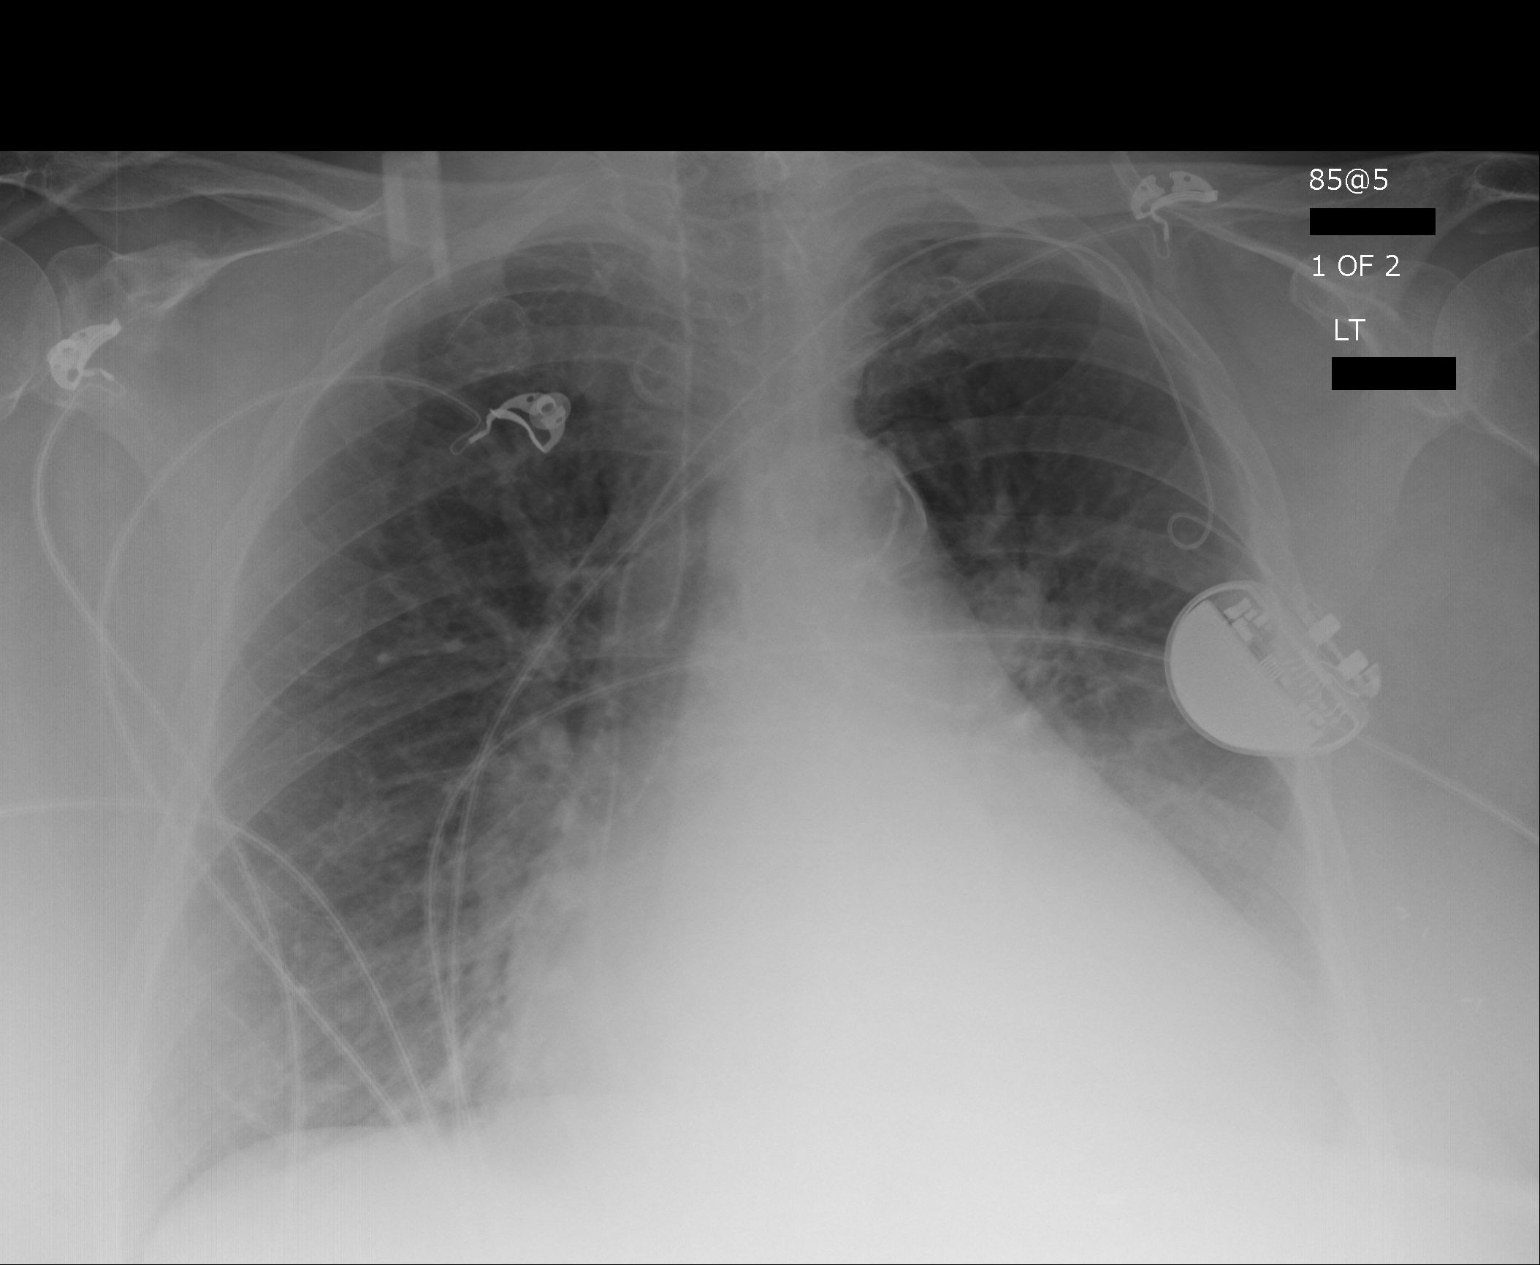

[2 of 2 positions shown; findings below may reference images not displayed]

FINDINGS: Cardiac shadow remains enlarged. A pacing device is again seen and
stable. Patchy changes remain in the left lower lobe stable from the
prior exam. No new focal infiltrate is seen. Mild stable vascular
congestion is seen.
IMPRESSION: No significant interval change from the prior exam.

## 2017-07-22 IMAGING — CR DG CHEST 2V
1 series · 2 of 2 positions shown · non-contrast
Comparison: 01/02/2016.

CLINICAL DATA: CHF.

EXAM:
CHEST  2 VIEW

[Series 1: dg chest 2 view · 0.14mm/px · 2 of 2 slices shown]
[im 1/2]
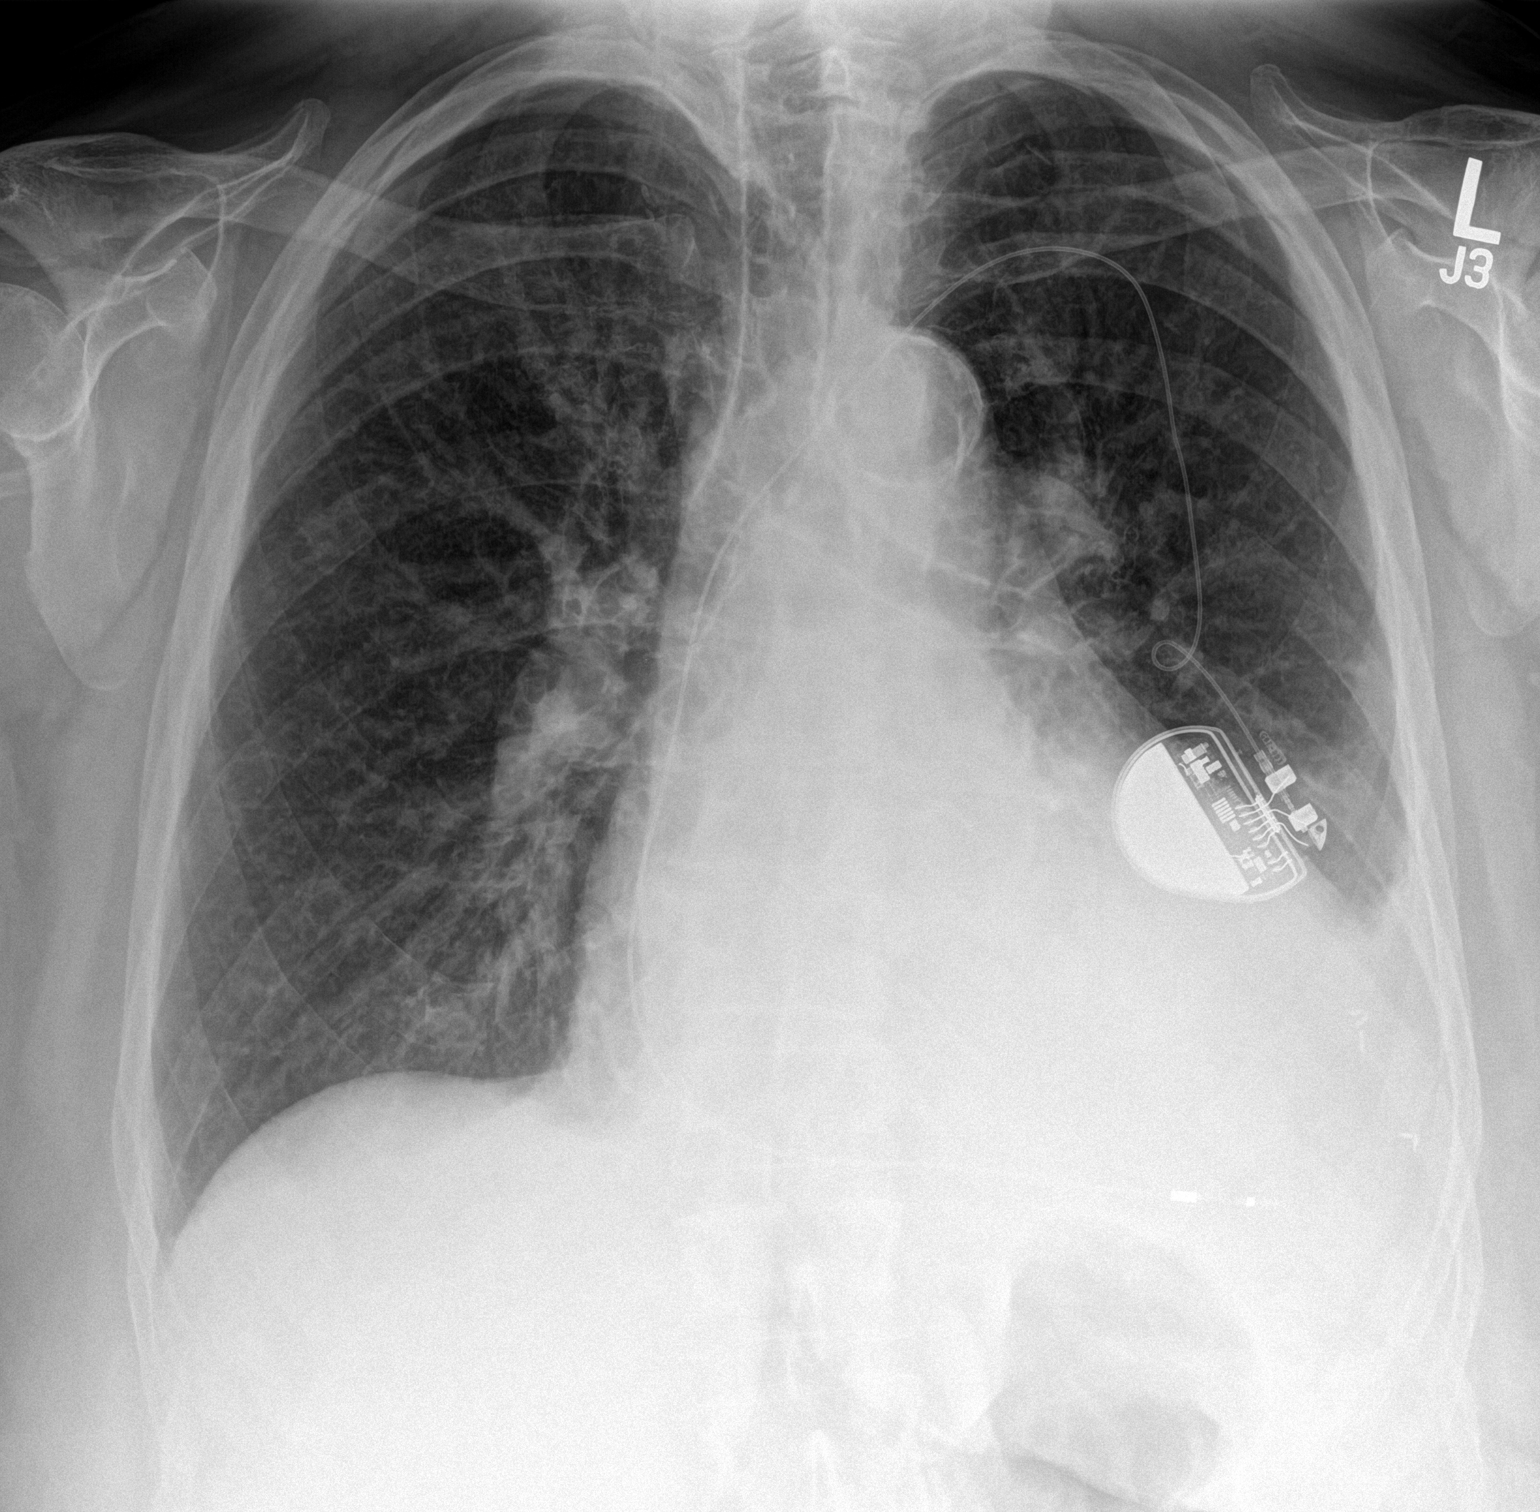
[im 2/2]
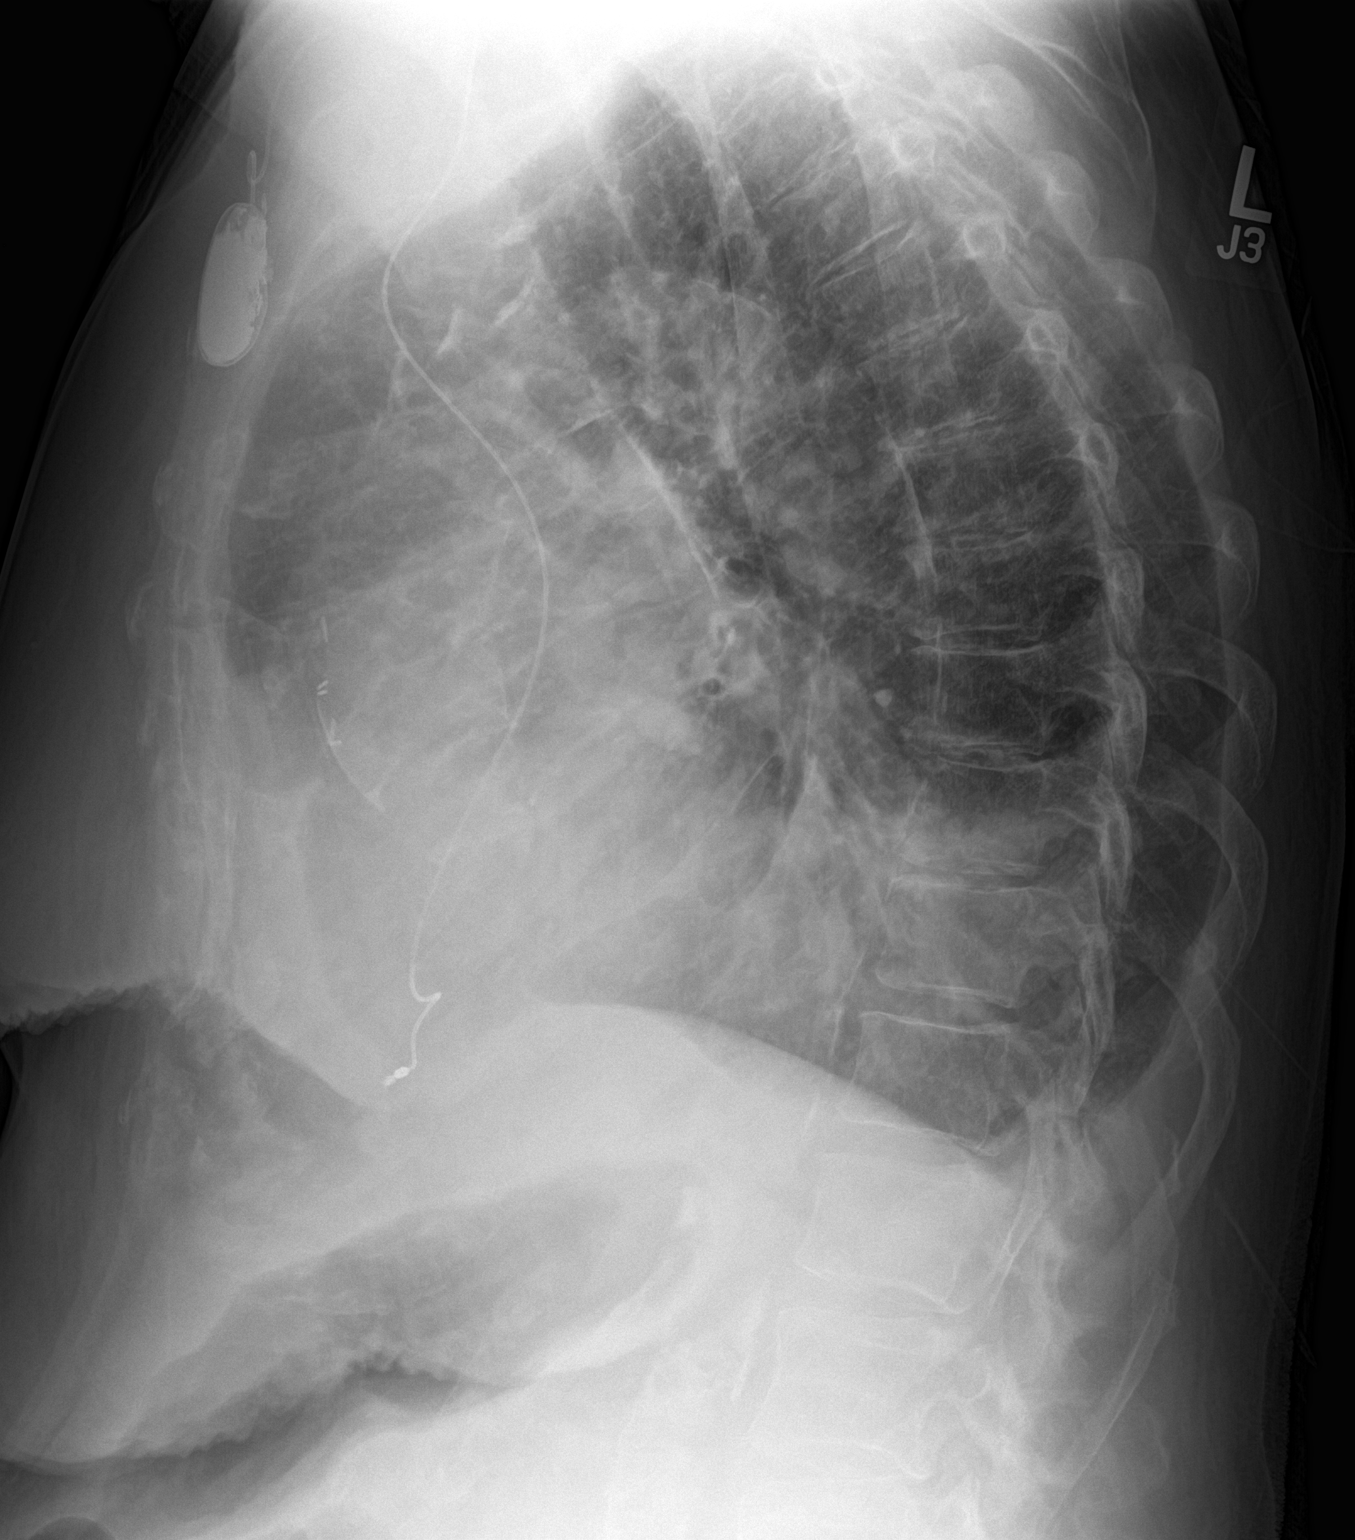

[2 of 2 positions shown; findings below may reference images not displayed]

FINDINGS: Cardiac pacer noted lead tips in right atrium right ventricle.
Prominent cardiomegaly with pulmonary vascular prominence and
bilateral interstitial prominence consistent with congestive heart
failure. Prominent left pleural effusion. Findings have progressed
from prior exam . No pneumothorax.
IMPRESSION: Congestive heart failure with pulmonary interstitial edema
left-sided pleural effusion. Findings have progressed from prior
exam . Cardiac pacer with lead tips in right atrium right ventricle.

## 2017-11-15 IMAGING — US US RENAL
1 series · 14 of 25 positions shown · non-contrast
Comparison: None.

CLINICAL DATA: Acute renal failure

EXAM:
RENAL / URINARY TRACT ULTRASOUND COMPLETE

[Series 1: us renal · 0.26mm/px · 14 of 30 slices shown]
[im 1/30]
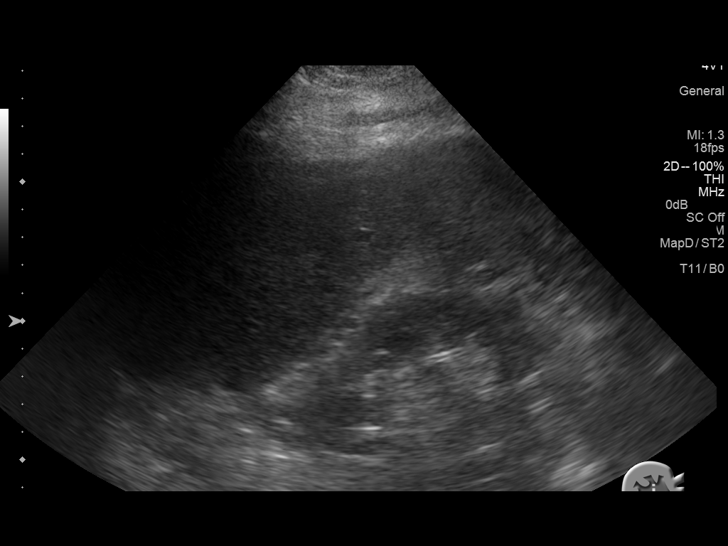
[im 3/30]
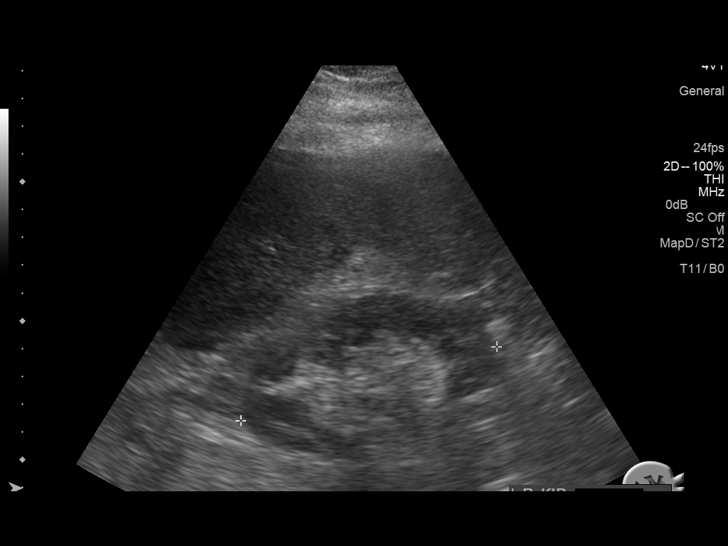
[im 5/30]
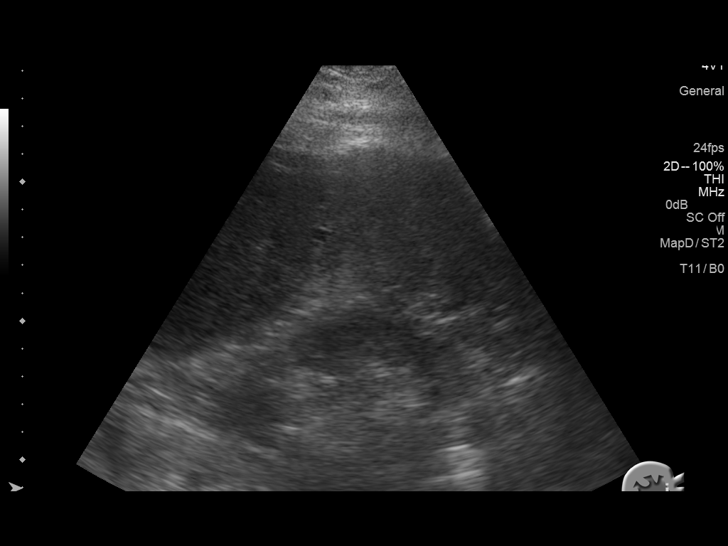
[im 8/30]
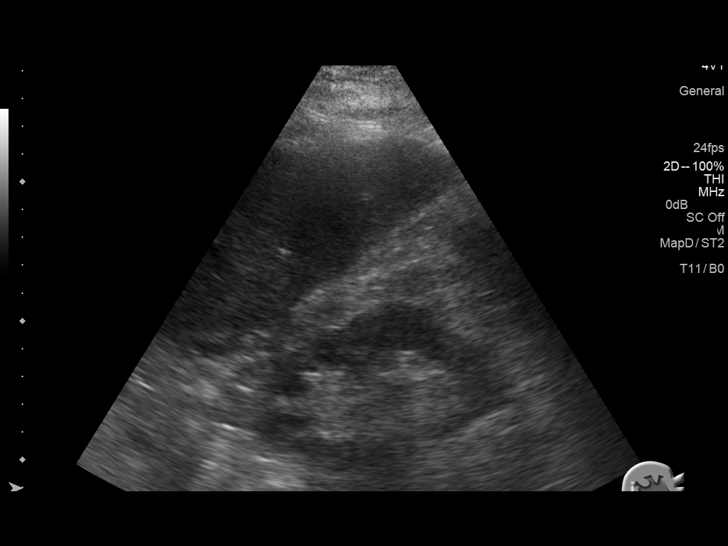
[im 10/30]
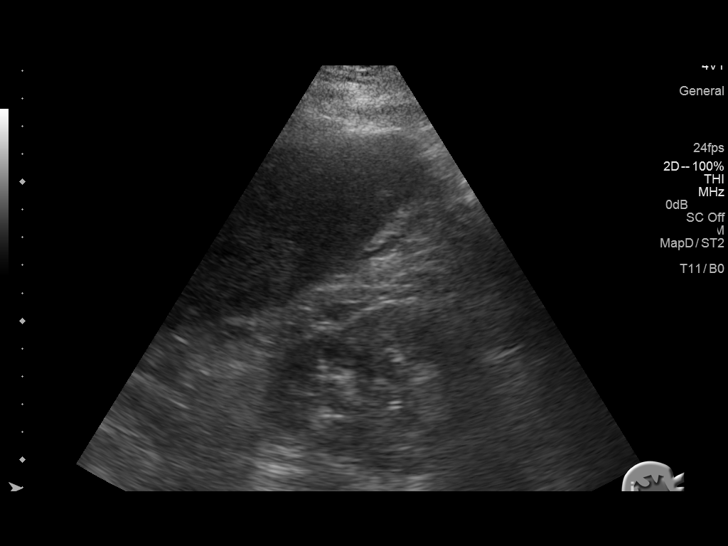
[im 11/30]
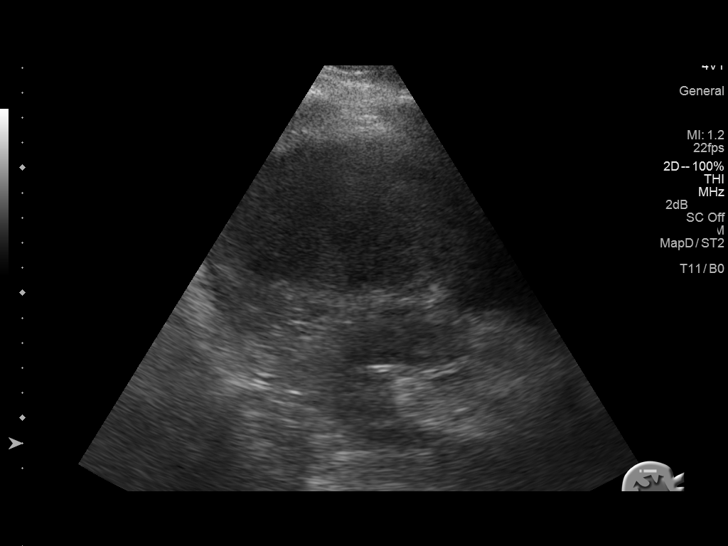
[im 14/30]
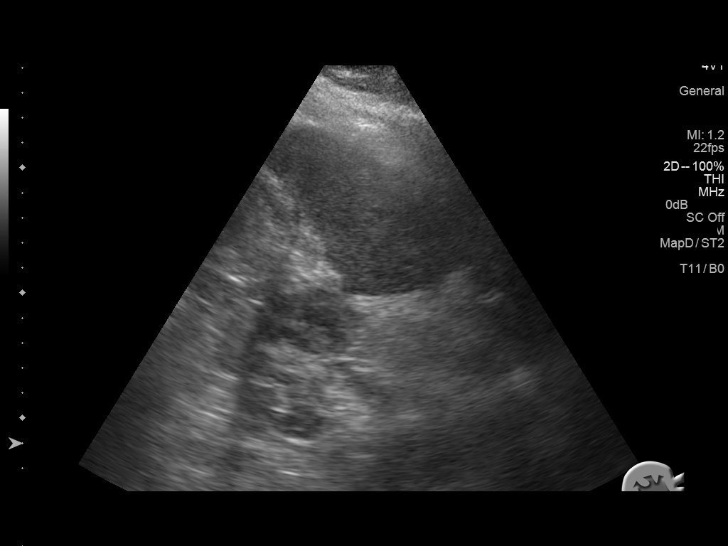
[im 16/30]
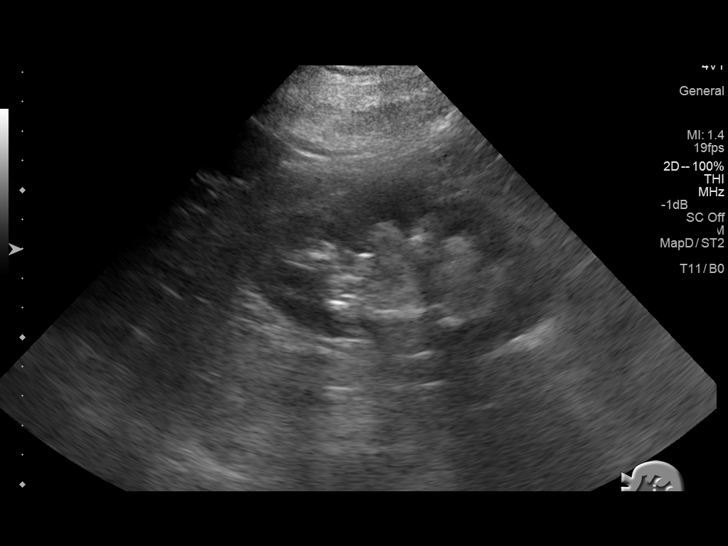
[im 19/30]
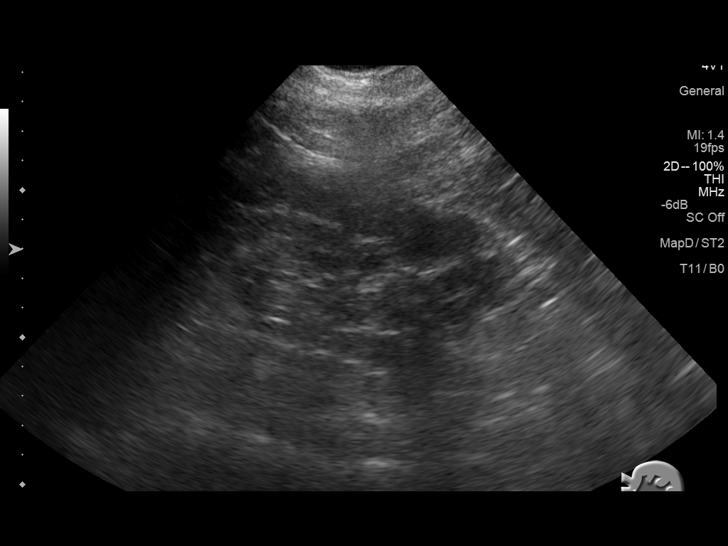
[im 20/30]
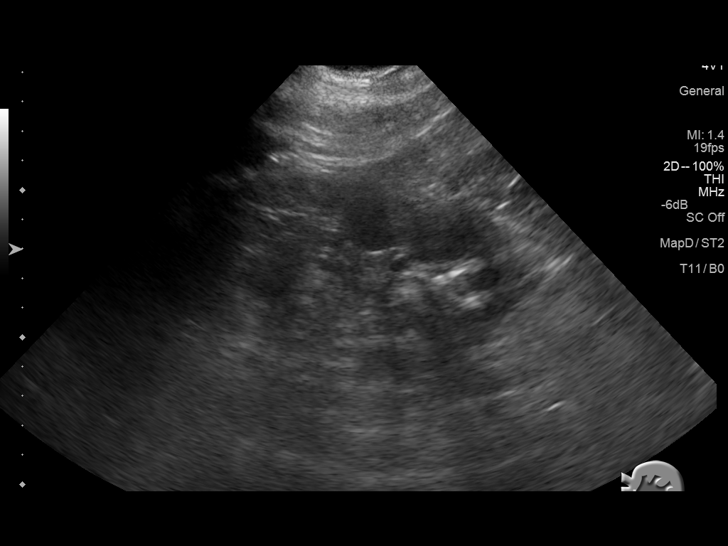
[im 22/30]
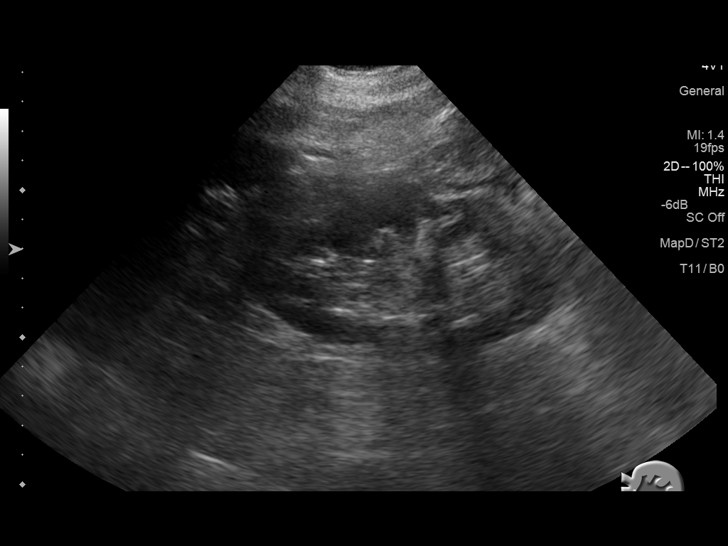
[im 25/30]
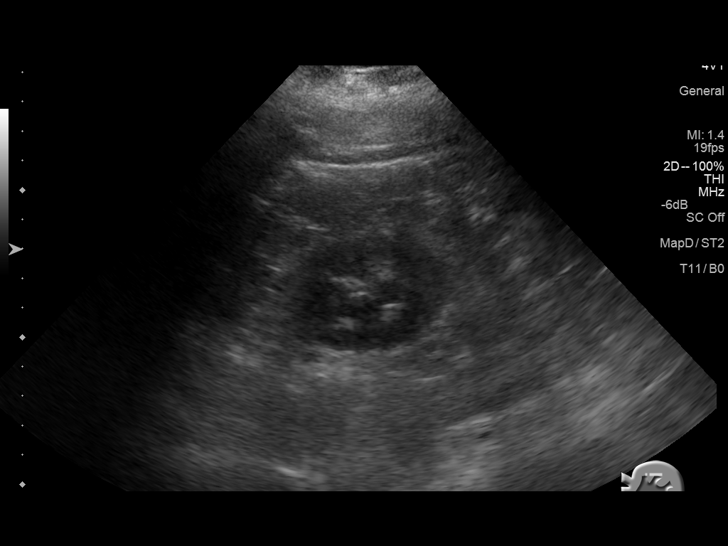
[im 27/30]
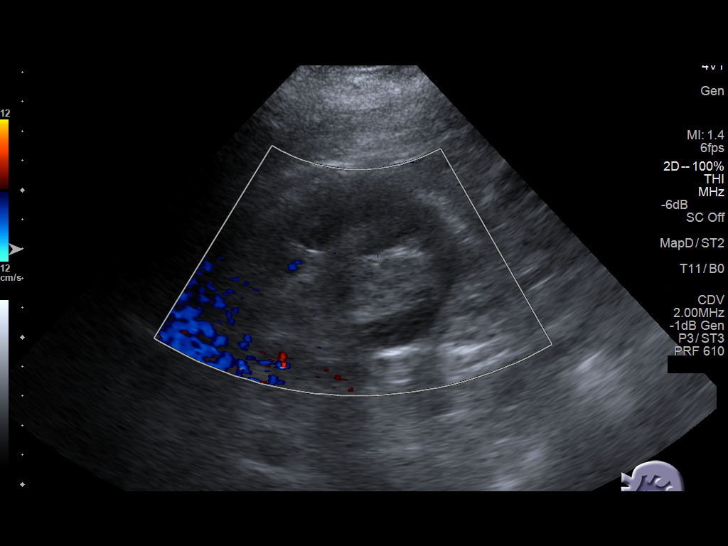
[im 30/30]
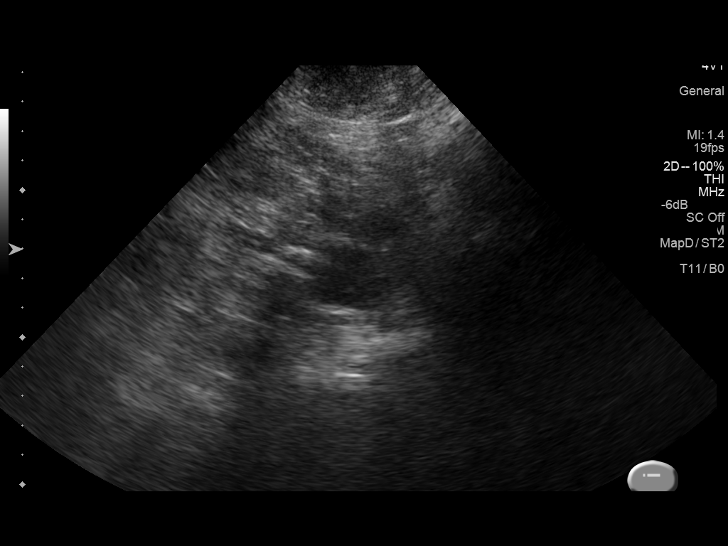

[14 of 25 positions shown; findings below may reference images not displayed]

FINDINGS: Right Kidney:

Length: 9.6 cm. Echogenicity within normal limits. No mass or
hydronephrosis visualized. Probable linear small vascular
calcifications are noted in midpole.

Left Kidney:

Length: 10.9 cm. Echogenicity within normal limits. No mass or
hydronephrosis visualized. Probable small vascular calcifications
are noted

Bladder:

Urinary bladder is decompressed with Foley catheter.
IMPRESSION: 1. No hydronephrosis. No focal renal mass. Probable bilateral renal
small vascular calcifications. Decompressed urinary bladder with
Foley catheter.
